# Patient Record
Sex: Female | Born: 1985 | Race: White | Hispanic: No | Marital: Married | Smoking: Never smoker
Health system: Southern US, Community
[De-identification: ages and names within clinical notes are randomized; demographics above are authoritative.]

## PROBLEM LIST (undated history)

## (undated) DIAGNOSIS — E041 Nontoxic single thyroid nodule: Secondary | ICD-10-CM

## (undated) DIAGNOSIS — F909 Attention-deficit hyperactivity disorder, unspecified type: Secondary | ICD-10-CM

## (undated) DIAGNOSIS — R87612 Low grade squamous intraepithelial lesion on cytologic smear of cervix (LGSIL): Secondary | ICD-10-CM

## (undated) DIAGNOSIS — A630 Anogenital (venereal) warts: Secondary | ICD-10-CM

## (undated) DIAGNOSIS — E8881 Metabolic syndrome: Secondary | ICD-10-CM

## (undated) DIAGNOSIS — N83209 Unspecified ovarian cyst, unspecified side: Secondary | ICD-10-CM

## (undated) DIAGNOSIS — G43909 Migraine, unspecified, not intractable, without status migrainosus: Secondary | ICD-10-CM

## (undated) HISTORY — DX: Nontoxic single thyroid nodule: E04.1

## (undated) HISTORY — DX: Unspecified ovarian cyst, unspecified side: N83.209

## (undated) HISTORY — DX: Anogenital (venereal) warts: A63.0

## (undated) HISTORY — DX: Metabolic syndrome: E88.81

## (undated) HISTORY — PX: BREAST ENHANCEMENT SURGERY: SHX7

## (undated) HISTORY — DX: Attention-deficit hyperactivity disorder, unspecified type: F90.9

## (undated) HISTORY — DX: Low grade squamous intraepithelial lesion on cytologic smear of cervix (LGSIL): R87.612

---
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
## 2009-07-01 ENCOUNTER — Emergency Department: Payer: Self-pay | Admitting: Unknown Physician Specialty

## 2010-07-29 ENCOUNTER — Emergency Department: Payer: Self-pay | Admitting: Emergency Medicine

## 2010-11-24 ENCOUNTER — Ambulatory Visit: Payer: Self-pay | Admitting: Family Medicine

## 2011-03-23 DIAGNOSIS — E8881 Metabolic syndrome: Secondary | ICD-10-CM

## 2011-03-23 HISTORY — DX: Metabolic syndrome: E88.81

## 2011-03-23 HISTORY — DX: Metabolic syndrome: E88.810

## 2011-11-18 ENCOUNTER — Emergency Department: Payer: Self-pay | Admitting: Emergency Medicine

## 2011-11-18 LAB — URINALYSIS, COMPLETE
Bilirubin,UR: NEGATIVE
Glucose,UR: NEGATIVE mg/dL (ref 0–75)
Ketone: NEGATIVE
Ph: 5 (ref 4.5–8.0)
RBC,UR: 26 /HPF (ref 0–5)
Squamous Epithelial: 7
WBC UR: 8 /HPF (ref 0–5)

## 2011-11-19 ENCOUNTER — Encounter (HOSPITAL_COMMUNITY): Payer: Self-pay | Admitting: Nurse Practitioner

## 2011-11-19 ENCOUNTER — Emergency Department (HOSPITAL_COMMUNITY)
Admission: EM | Admit: 2011-11-19 | Discharge: 2011-11-19 | Disposition: A | Discharge status: Home / Self Care | Payer: 59 | Attending: Emergency Medicine | Admitting: Emergency Medicine

## 2011-11-19 DIAGNOSIS — R51 Headache: Secondary | ICD-10-CM

## 2011-11-19 DIAGNOSIS — H53149 Visual discomfort, unspecified: Secondary | ICD-10-CM | POA: Insufficient documentation

## 2011-11-19 HISTORY — DX: Migraine, unspecified, not intractable, without status migrainosus: G43.909

## 2011-11-19 MED ORDER — METOCLOPRAMIDE HCL 5 MG/ML IJ SOLN
10.0000 mg | Freq: Once | INTRAMUSCULAR | Status: AC
  Administered 2011-11-19: 10 mg via INTRAVENOUS
  Filled 2011-11-19: qty 2

## 2011-11-19 MED ORDER — BUTALBITAL-APAP-CAFFEINE 50-325-40 MG PO TABS
1.0000 | ORAL_TABLET | Freq: Three times a day (TID) | ORAL | Status: AC | PRN
Start: 2011-11-19 — End: 2012-11-18

## 2011-11-19 MED ORDER — KETOROLAC TROMETHAMINE 30 MG/ML IJ SOLN
30.0000 mg | Freq: Once | INTRAMUSCULAR | Status: AC
  Administered 2011-11-19: 30 mg via INTRAVENOUS
  Filled 2011-11-19: qty 1

## 2011-11-19 MED ORDER — DIPHENHYDRAMINE HCL 50 MG/ML IJ SOLN
25.0000 mg | Freq: Once | INTRAMUSCULAR | Status: AC
  Administered 2011-11-19: 25 mg via INTRAVENOUS
  Filled 2011-11-19: qty 1

## 2011-11-19 NOTE — ED Notes (Signed)
Pt reports she has chronic migraines, pt reports since Tuesday she has been having pressure behind her left eye, her doctor told her if the pressure moved to her right eye then it would be concerning. Pt is now having bilateral pressure behind both eyes. Pt reports she has been taking meds but nothing has given her relief.

## 2011-11-19 NOTE — ED Notes (Addendum)
Pt c/o severe migraine since Tuesday. Hx migraines. Took topamax in the past with no relief. For this migraine she has been taking 800mg  Ibuprofen around the clock with no relief. Pt states migraine pain is usually only on R side but this time is on both sides and has been vomiting all morning. Pt doctor has been treating her with abx for UTI and rule out mono but her mono test came back negative this week. A&Ox4.

## 2011-11-19 NOTE — ED Provider Notes (Signed)
History     CSN: [id number]  Arrival date & time 11/19/11  1013   First MD Initiated Contact with Patient 11/19/11 1024      Chief Complaint  Patient presents with  . Migraine    (Consider location/radiation/quality/duration/timing/severity/associated sxs/prior treatment) HPI Comments: Patient presents with a complaint of headache behind both eyes for the past 5 days. This has been associated with nausea, vomiting, photophobia, phonophobia. Patient states she has a history of migraines. Typically her migraines a right-sided and are improved by ibuprofen or Fioricet. Patient states that ibuprofen has not been helping. She denies recent head injury. Patient denies weakness, numbness, tingling in her arms or legs. No vision changes. Patient has been on Topamax in the past for headaches however she has discontinued this. Patient denies fever. Onset gradual. Course is constant. Nothing makes symptoms better.  The history is provided by the patient.    Past Medical History  Diagnosis Date  . Migraine     History reviewed. No pertinent past surgical history.  History reviewed. No pertinent family history.  History  Substance Use Topics  . Smoking status: Never Smoker   . Smokeless tobacco: Not on file  . Alcohol Use: No    OB History    Grav Para Term Preterm Abortions TAB SAB Ect Mult Living                  Review of Systems  Constitutional: Negative for fever.  HENT: Negative for congestion, rhinorrhea, neck pain, neck stiffness, dental problem and sinus pressure.   Eyes: Positive for photophobia. Negative for discharge, redness and visual disturbance.  Respiratory: Negative for shortness of breath.   Cardiovascular: Negative for chest pain.  Gastrointestinal: Negative for nausea and vomiting.  Musculoskeletal: Negative for gait problem.  Skin: Negative for rash.  Neurological: Positive for headaches. Negative for syncope, speech difficulty, weakness, light-headedness  and numbness.  Psychiatric/Behavioral: Negative for confusion.    Allergies  Review of patient's allergies indicates no known allergies.  Home Medications   Current Outpatient Rx  Name Route Sig Dispense Refill  . AMOXICILLIN 875 MG PO TABS Oral Take 875 mg by mouth 2 (two) times daily.    Marland Kitchen CIPROFLOXACIN HCL 500 MG PO TABS Oral Take 500 mg by mouth 2 (two) times daily.    . IBUPROFEN 200 MG PO TABS Oral Take 800 mg by mouth every 6 (six) hours as needed. For pain      BP 119/90  Pulse 90  Temp 98.1 F (36.7 C) (Oral)  Resp 20  SpO2 100%  Physical Exam  Nursing note and vitals reviewed. Constitutional: She is oriented to person, place, and time. She appears well-developed and well-nourished.  HENT:  Head: Normocephalic and atraumatic.  Right Ear: Tympanic membrane, external ear and ear canal normal.  Left Ear: Tympanic membrane, external ear and ear canal normal.  Nose: Nose normal.  Mouth/Throat: Uvula is midline, oropharynx is clear and moist and mucous membranes are normal.  Eyes: Conjunctivae normal, EOM and lids are normal. Pupils are equal, round, and reactive to light. Right eye exhibits no nystagmus. Left eye exhibits no nystagmus.  Neck: Normal range of motion. Neck supple.  Cardiovascular: Normal rate and regular rhythm.   Pulmonary/Chest: Effort normal and breath sounds normal.  Abdominal: Soft. There is no tenderness.  Musculoskeletal:       Cervical back: She exhibits normal range of motion, no tenderness and no bony tenderness.  Neurological: She is alert and oriented to  person, place, and time. She has normal strength and normal reflexes. No cranial nerve deficit or sensory deficit. She displays a negative Romberg sign. Coordination and gait normal. GCS eye subscore is 4. GCS verbal subscore is 5. GCS motor subscore is 6.  Skin: Skin is warm and dry.  Psychiatric: She has a normal mood and affect.    ED Course  Procedures (including critical care  time)  Labs Reviewed - No data to display No results found.   1. Headache     11:47 AM Patient seen and examined. Medications ordered.   Vital signs reviewed and are as follows: Filed Vitals:   11/19/11 1017  BP: 119/90  Pulse: 90  Temp: 98.1 F (36.7 C)  Resp: 20   Patient states HA not really changed after treatment. Toradol ordered. Patient requests d/c to home. She wants to follow-up with PCP. Urged patient to do so. Will d/c.    MDM  Patient with HA. No thunderclap, no head injury. Normal neurological exam. Patient has previous migraines although HA usually unilateral. Patient appropriate for d/c to home with PCP follow-up. No acute indications for CT scan. Patient appears well.         Renne Crigler, [place] 11/19/11 [phone]

## 2011-11-19 NOTE — ED Notes (Signed)
Pt reports she has gotten some relief with the medication but still can't get comfortable.

## 2011-11-20 NOTE — ED Provider Notes (Signed)
Medical screening examination/treatment/procedure(s) were performed by non-physician practitioner and as supervising physician I was immediately available for consultation/collaboration.  Tobin Chad, MD 11/20/11 [phone]

## 2015-04-26 ENCOUNTER — Other Ambulatory Visit: Payer: Self-pay | Admitting: Nurse Practitioner

## 2015-04-26 DIAGNOSIS — G43809 Other migraine, not intractable, without status migrainosus: Secondary | ICD-10-CM

## 2015-05-13 ENCOUNTER — Ambulatory Visit
Admission: RE | Admit: 2015-05-13 | Discharge: 2015-05-13 | Disposition: A | Discharge status: Home / Self Care | Payer: Medicaid Other | Source: Ambulatory Visit | Attending: Nurse Practitioner | Admitting: Nurse Practitioner

## 2015-05-13 DIAGNOSIS — R590 Localized enlarged lymph nodes: Secondary | ICD-10-CM | POA: Insufficient documentation

## 2015-05-13 DIAGNOSIS — R51 Headache: Secondary | ICD-10-CM | POA: Diagnosis present

## 2015-05-13 DIAGNOSIS — G43809 Other migraine, not intractable, without status migrainosus: Secondary | ICD-10-CM

## 2015-07-27 ENCOUNTER — Emergency Department
Admission: EM | Admit: 2015-07-27 | Discharge: 2015-07-27 | Disposition: A | Discharge status: Home / Self Care | Payer: Medicaid Other | Attending: Emergency Medicine | Admitting: Emergency Medicine

## 2015-07-27 ENCOUNTER — Emergency Department: Payer: Medicaid Other

## 2015-07-27 DIAGNOSIS — G43809 Other migraine, not intractable, without status migrainosus: Secondary | ICD-10-CM | POA: Insufficient documentation

## 2015-07-27 DIAGNOSIS — R51 Headache: Secondary | ICD-10-CM | POA: Diagnosis present

## 2015-07-27 MED ORDER — KETOROLAC TROMETHAMINE 30 MG/ML IJ SOLN
30.0000 mg | Freq: Once | INTRAMUSCULAR | Status: AC
  Administered 2015-07-27: 30 mg via INTRAVENOUS
  Filled 2015-07-27: qty 1

## 2015-07-27 MED ORDER — DIPHENHYDRAMINE HCL 50 MG/ML IJ SOLN
12.5000 mg | Freq: Once | INTRAMUSCULAR | Status: AC
  Administered 2015-07-27: 12.5 mg via INTRAVENOUS
  Filled 2015-07-27: qty 1

## 2015-07-27 MED ORDER — PROCHLORPERAZINE EDISYLATE 5 MG/ML IJ SOLN
5.0000 mg | Freq: Once | INTRAMUSCULAR | Status: AC
  Administered 2015-07-27: 5 mg via INTRAVENOUS
  Filled 2015-07-27: qty 2

## 2015-07-27 MED ORDER — PROCHLORPERAZINE MALEATE 10 MG PO TABS: 10.0000 mg | ORAL_TABLET | Freq: Four times a day (QID) | ORAL | Status: DC | PRN

## 2015-07-27 MED ORDER — DEXTROSE-NACL 5-0.45 % IV SOLN
INTRAVENOUS | Status: DC
  Administered 2015-07-27: 06:00:00 via INTRAVENOUS

## 2015-07-27 MED ORDER — DEXAMETHASONE SODIUM PHOSPHATE 10 MG/ML IJ SOLN
10.0000 mg | Freq: Once | INTRAMUSCULAR | Status: AC
  Administered 2015-07-27: 10 mg via INTRAVENOUS
  Filled 2015-07-27: qty 1

## 2015-07-27 NOTE — ED Provider Notes (Signed)
[facility] Emergency Department Provider Note   ____________________________________________  Time seen: Approximately 5:42 AM  I have reviewed the triage vital signs and the nursing notes.   HISTORY  Chief Complaint Headache    HPI Carolyn Edwards is a 30 y.o. female who presents to the ED from home with a chief complaint of migraine headache. Patient reports history of migraines; treated by her PCP. Had a recent negative MRI of her brain in April. Reports gradual onset global headache 5-6 days. Has been seen by urgent care twice; on the first visit she was diagnosed with sinusitis and started on amoxicillin. On the second visit she was given "a shot in my butt" which relieved her headache for several hours. Symptoms associated with photophobia, dizziness and nausea. Denies associated fever, chills, ear pain, sore throat, chest pain, shortness of breath, abdominal pain, vomiting, diarrhea. Denies recent travel or trauma. The shot made her pain better temporarily; nothing makes her pain worse. States she has brushed ticks off of her daughter but has not removed any from herself.   Past Medical History  Diagnosis Date  . Migraine     There are no active problems to display for this patient.   No past surgical history on file.  No current outpatient prescriptions on file.  Allergies Review of patient's allergies indicates no known allergies.  Family history None for migraines or cerebral aneurysms  Social History Social History  Substance Use Topics  . Smoking status: Never Smoker   . Smokeless tobacco: Not on file  . Alcohol Use: No    Review of Systems  Constitutional: No fever/chills. Eyes: Positive for photophobia. No visual changes. ENT: No sore throat. Cardiovascular: Denies chest pain. Respiratory: Denies shortness of breath. Gastrointestinal: No abdominal pain.  No nausea, no vomiting.  No diarrhea.  No  constipation. Genitourinary: Negative for dysuria. Musculoskeletal: Negative for back pain. Skin: Negative for rash. Neurological: Positive for headache. Negative for focal weakness or numbness.  10-point ROS otherwise negative.  ____________________________________________   PHYSICAL EXAM:  VITAL SIGNS: ED Triage Vitals  Enc Vitals Group     BP 07/27/15 0425 129/82 mmHg     Pulse Rate 07/27/15 0423 70     Resp 07/27/15 0423 18     Temp 07/27/15 0423 98.7 F (37.1 C)     Temp Source 07/27/15 0423 Oral     SpO2 07/27/15 0423 100 %     Weight 07/27/15 0423 130 lb (58.968 kg)     Height 07/27/15 0423 5\' 4"  (1.626 m)     Head Cir --      Peak Flow --      Pain Score 07/27/15 0424 9     Pain Loc --      Pain Edu? --      Excl. in GC? --     Constitutional: Alert and oriented. Well appearing and in mild acute distress. Eyes: Conjunctivae are normal. PERRL. EOMI. Head: Atraumatic. Sinuses nontender to palpation. Nose: No congestion/rhinnorhea. Mouth/Throat: Mucous membranes are moist.  Oropharynx non-erythematous. Neck: No stridor.  Carotid bruits.  Supple neck without meningismus. Cardiovascular: Normal rate, regular rhythm. Grossly normal heart sounds.  Good peripheral circulation. Respiratory: Normal respiratory effort.  No retractions. Lungs CTAB. Gastrointestinal: Soft and nontender. No distention. No abdominal bruits. No CVA tenderness. Musculoskeletal: No lower extremity tenderness nor edema.  No joint effusions. Neurologic:  Normal speech and language. No gross focal neurologic deficits are appreciated. No gait instability. Skin:  Skin is warm, dry and intact. No rash noted. No petechiae. Psychiatric: Mood and affect are normal. Speech and behavior are normal.  ____________________________________________   LABS (all labs ordered are listed, but only abnormal results are displayed)  Labs Reviewed - No data to  display ____________________________________________  EKG  None ____________________________________________  RADIOLOGY  CT head without contrast interpreted per Dr. Grace IsaacWatts: Negative head CT. ____________________________________________   PROCEDURES  Procedure(s) performed: None  Critical Care performed: No  ____________________________________________   INITIAL IMPRESSION / ASSESSMENT AND PLAN / ED COURSE  Pertinent labs & imaging results that were available during my care of the patient were reviewed by me and considered in my medical decision making (see chart for details).  30 year old female who presents with an almost one-week history of global headache. Supple neck without focal neurological deficits. Will obtain CT scan and try headache cocktail.  ----------------------------------------- 7:24 AM on 07/27/2015 -----------------------------------------  Headache improving; states top of head still hurts. Updated patient and mother of negative head CT. Will add Toradol and Decadron. We discussed empirically treating her with doxycycline given her exposure to ticks but no definitive tick bites. Patient is going to the beach next week and is reluctant to take doxycycline given its interaction with the sun. We will draw titers to check RMSF as well as Lyme disease. Strict return precautions given. Both verbalize understanding and agree with plan of care. ____________________________________________   FINAL CLINICAL IMPRESSION(S) / ED DIAGNOSES  Final diagnoses:  Other migraine without status migrainosus, not intractable      NEW MEDICATIONS STARTED DURING THIS VISIT:  New Prescriptions   No medications on file     Note:  This document was prepared using Dragon voice recognition software and may include unintentional dictation errors.    Irean HongJade J Charice Zuno, MD 07/27/15 [id number]

## 2015-07-27 NOTE — Discharge Instructions (Signed)
1. You may take Compazine (#20) as needed for headache. 2. You will be notified of any positive test results for Lyme or RMSF. 3. Return to the ER for worsening symptoms, persistent vomiting, lethargy or other concerns.  Recurrent Migraine Headache A migraine headache is an intense, throbbing pain on one or both sides of your head. Recurrent migraines keep coming back. A migraine can last for 30 minutes to several hours. CAUSES  The exact cause of a migraine headache is not always known. However, a migraine may be caused when nerves in the brain become irritated and release chemicals that cause inflammation. This causes pain. Certain things may also trigger migraines, such as:   Alcohol.  Smoking.  Stress.  Menstruation.  Aged cheeses.  Foods or drinks that contain nitrates, glutamate, aspartame, or tyramine.  Lack of sleep.  Chocolate.  Caffeine.  Hunger.  Physical exertion.  Fatigue.  Medicines used to treat chest pain (nitroglycerine), birth control pills, estrogen, and some blood pressure medicines. SYMPTOMS   Pain on one or both sides of your head.  Pulsating or throbbing pain.  Severe pain that prevents daily activities.  Pain that is aggravated by any physical activity.  Nausea, vomiting, or both.  Dizziness.  Pain with exposure to bright lights, loud noises, or activity.  General sensitivity to bright lights, loud noises, or smells. Before you get a migraine, you may get warning signs that a migraine is coming (aura). An aura may include:  Seeing flashing lights.  Seeing bright spots, halos, or zigzag lines.  Having tunnel vision or blurred vision.  Having feelings of numbness or tingling.  Having trouble talking.  Having muscle weakness. DIAGNOSIS  A recurrent migraine headache is often diagnosed based on:  Symptoms.  Physical examination.  A CT scan or MRI of your head. These imaging tests cannot diagnose migraines but can help rule  out other causes of headaches.  TREATMENT  Medicines may be given for pain and nausea. Medicines can also be given to help prevent recurrent migraines. HOME CARE INSTRUCTIONS  Only take over-the-counter or prescription medicines for pain or discomfort as directed by your health care provider. The use of long-term narcotics is not recommended.  Lie down in a dark, quiet room when you have a migraine.  Keep a journal to find out what may trigger your migraine headaches. For example, write down:  What you eat and drink.  How much sleep you get.  Any change to your diet or medicines.  Limit alcohol consumption.  Quit smoking if you smoke.  Get 7-9 hours of sleep, or as recommended by your health care provider.  Limit stress.  Keep lights dim if bright lights bother you and make your migraines worse. SEEK MEDICAL CARE IF:   You do not get relief from the medicines given to you.  You have a recurrence of pain.  You have a fever. SEEK IMMEDIATE MEDICAL CARE IF:  Your migraine becomes severe.  You have a stiff neck.  You have loss of vision.  You have muscular weakness or loss of muscle control.  You start losing your balance or have trouble walking.  You feel faint or pass out.  You have severe symptoms that are different from your first symptoms. MAKE SURE YOU:   Understand these instructions.  Will watch your condition.  Will get help right away if you are not doing well or get worse.   This information is not intended to replace advice given to you by  your health care provider. Make sure you discuss any questions you have with your health care provider.   Document Released: 10/11/2000 Document Revised: 02/06/2014 Document Reviewed: 09/23/2012 Elsevier Interactive Patient Education [employer]2016 Else[employer] Inc.

## 2015-07-27 NOTE — ED Notes (Signed)
Pt reports since last Wednesday has had HA to temples; st has been seen at urgent care x 2. Pt has hx of headaches states feels different.

## 2015-07-27 NOTE — ED Notes (Signed)
Pt presents to ED with c/o generalized HA/migraine since last Wednesday. Pt reports h/x of migraines but states this is different from her usual migraine pain. Pt states "usual migraines are on the right side behind the right eye", but reports this HA is "all over" and generalized. Pt reports (+) nausea, but denies vomiting. Pt reports dizziness and lightheadedness, but denies unilateral weakness in her upper or lower extremities. Pt reports photophobia with her usual migraines, but "with this HA it isn't so bad".

## 2015-07-29 LAB — B. BURGDORFI ANTIBODIES

## 2015-07-29 LAB — ROCKY MTN SPOTTED FVR ABS PNL(IGG+IGM)
RMSF IGG: NEGATIVE
RMSF IGM: 0.6 {index} (ref 0.00–0.89)

## 2015-07-30 ENCOUNTER — Telehealth: Payer: Self-pay | Admitting: Emergency Medicine

## 2015-07-30 NOTE — ED Notes (Signed)
Called patient to inform of rmsf and lyme test results.  Left message asking her to call me or call pcp for follow up.

## 2015-11-23 DIAGNOSIS — N83209 Unspecified ovarian cyst, unspecified side: Secondary | ICD-10-CM

## 2015-11-23 HISTORY — DX: Unspecified ovarian cyst, unspecified side: N83.209

## 2017-01-02 ENCOUNTER — Other Ambulatory Visit: Payer: Self-pay

## 2017-01-02 ENCOUNTER — Encounter: Payer: Self-pay | Admitting: Obstetrics and Gynecology

## 2017-01-02 ENCOUNTER — Ambulatory Visit (INDEPENDENT_AMBULATORY_CARE_PROVIDER_SITE_OTHER): Payer: Medicaid Other | Admitting: Obstetrics and Gynecology

## 2017-01-02 VITALS — BP 94/58 | HR 69 | Ht 65.0 in | Wt 138.0 lb

## 2017-01-02 DIAGNOSIS — Z01419 Encounter for gynecological examination (general) (routine) without abnormal findings: Secondary | ICD-10-CM | POA: Diagnosis not present

## 2017-01-02 DIAGNOSIS — Z0000 Encounter for general adult medical examination without abnormal findings: Secondary | ICD-10-CM | POA: Diagnosis not present

## 2017-01-02 DIAGNOSIS — T8339XA Other mechanical complication of intrauterine contraceptive device, initial encounter: Secondary | ICD-10-CM

## 2017-01-02 NOTE — Patient Instructions (Signed)
Preventive Care 18-39 Years, Female Preventive care refers to lifestyle choices and visits with your health care provider that can promote health and wellness. What does preventive care include?  A yearly physical exam. This is also called an annual well check.  Dental exams once or twice a year.  Routine eye exams. Ask your health care provider how often you should have your eyes checked.  Personal lifestyle choices, including: ? Daily care of your teeth and gums. ? Regular physical activity. ? Eating a healthy diet. ? Avoiding tobacco and drug use. ? Limiting alcohol use. ? Practicing safe sex. ? Taking vitamin and mineral supplements as recommended by your health care provider. What happens during an annual well check? The services and screenings done by your health care provider during your annual well check will depend on your age, overall health, lifestyle risk factors, and family history of disease. Counseling Your health care provider may ask you questions about your:  Alcohol use.  Tobacco use.  Drug use.  Emotional well-being.  Home and relationship well-being.  Sexual activity.  Eating habits.  Work and work Statistician.  Method of birth control.  Menstrual cycle.  Pregnancy history.  Screening You may have the following tests or measurements:  Height, weight, and BMI.  Diabetes screening. This is done by checking your blood sugar (glucose) after you have not eaten for a while (fasting).  Blood pressure.  Lipid and cholesterol levels. These may be checked every 5 years starting at age 66.  Skin check.  Hepatitis C blood test.  Hepatitis B blood test.  Sexually transmitted disease (STD) testing.  BRCA-related cancer screening. This may be done if you have a family history of breast, ovarian, tubal, or peritoneal cancers.  Pelvic exam and Pap test. This may be done every 3 years starting at age 40. Starting at age 59, this may be done every 5  years if you have a Pap test in combination with an HPV test.  Discuss your test results, treatment options, and if necessary, the need for more tests with your health care provider. Vaccines Your health care provider may recommend certain vaccines, such as:  Influenza vaccine. This is recommended every year.  Tetanus, diphtheria, and acellular pertussis (Tdap, Td) vaccine. You may need a Td booster every 10 years.  Varicella vaccine. You may need this if you have not been vaccinated.  HPV vaccine. If you are 69 or younger, you may need three doses over 6 months.  Measles, mumps, and rubella (MMR) vaccine. You may need at least one dose of MMR. You may also need a second dose.  Pneumococcal 13-valent conjugate (PCV13) vaccine. You may need this if you have certain conditions and were not previously vaccinated.  Pneumococcal polysaccharide (PPSV23) vaccine. You may need one or two doses if you smoke cigarettes or if you have certain conditions.  Meningococcal vaccine. One dose is recommended if you are age 27-21 years and a first-year college student living in a residence hall, or if you have one of several medical conditions. You may also need additional booster doses.  Hepatitis A vaccine. You may need this if you have certain conditions or if you travel or work in places where you may be exposed to hepatitis A.  Hepatitis B vaccine. You may need this if you have certain conditions or if you travel or work in places where you may be exposed to hepatitis B.  Haemophilus influenzae type b (Hib) vaccine. You may need this if  you have certain risk factors.  Talk to your health care provider about which screenings and vaccines you need and how often you need them. This information is not intended to replace advice given to you by your health care provider. Make sure you discuss any questions you have with your health care provider. Document Released: 03/14/2001 Document Revised: 10/06/2015  Document Reviewed: 11/17/2014 Elsevier Interactive Patient Education  2017 [employer].

## 2017-01-02 NOTE — Progress Notes (Signed)
Gynecology Annual Exam  PCP: Lyndon CodeKhan, Fozia M, MD  Chief Complaint:  Chief Complaint  Patient presents with  . Gynecologic Exam    Mirena expired July, constant pink d/c & nausea, - icon w/pcp Friday    History of Present Illness: Patient is a 31 y.o. G1P1001 presents for annual exam. The patient has no complaints today.   LMP: Patient's last menstrual period was 12/29/2016. Average Interval: regular, 28 days Duration of flow: 3 days Heavy Menses: no Clots: no Intermenstrual Bleeding: no Postcoital Bleeding: no Dysmenorrhea: no  The patient is sexually active. She currently uses IUD for contraception. She denies dyspareunia.  The patient does perform self breast exams.  There is no notable family history of breast or ovarian cancer in her family.  The patient wears seatbelts: yes.   The patient has regular exercise: not asked.    The patient denies current symptoms of depression.    Review of Systems: Review of Systems  Constitutional: Negative for chills and fever.  HENT: Negative for congestion.   Respiratory: Negative for cough and shortness of breath.   Cardiovascular: Negative for chest pain and palpitations.  Gastrointestinal: Negative for abdominal pain, constipation, diarrhea, heartburn, nausea and vomiting.  Genitourinary: Negative for dysuria, frequency and urgency.  Skin: Negative for itching and rash.  Neurological: Negative for dizziness and headaches.  Endo/Heme/Allergies: Negative for polydipsia.  Psychiatric/Behavioral: Negative for depression.    Past Medical History:  Past Medical History:  Diagnosis Date  . Dysmetabolic syndrome X 03/23/2011  . Genital warts   . LGSIL on Pap smear of cervix 2006  . Migraine   . Ovarian cyst 11/23/2015  . Thyroid nodule     Past Surgical History:  Past Surgical History:  Procedure Laterality Date  . BREAST ENHANCEMENT SURGERY    . COLPOSCOPY  01/2005   bxs neg  . INTRAUTERINE DEVICE (IUD) INSERTION   08/2006   Mirena    Gynecologic History:  Patient's last menstrual period was 12/29/2016. Contraception: IUD 06/20/2011 Last Pap: Results were:03/24/15 NIL and HR HPV negative   Obstetric History: G1P1001  Family History:  Family History  Problem Relation Age of Onset  . Diabetes Mother   . Colon cancer Maternal Grandfather [id number]    Social History:  Social History   Socioeconomic History  . Marital status: Married    Spouse name: Not on file  . Number of children: Not on file  . Years of education: Not on file  . Highest education level: Not on file  Social Needs  . Financial resource strain: Not on file  . Food insecurity - worry: Not on file  . Food insecurity - inability: Not on file  . Transportation needs - medical: Not on file  . Transportation needs - non-medical: Not on file  Occupational History  . Not on file  Tobacco Use  . Smoking status: Never Smoker  . Smokeless tobacco: Never Used  Substance and Sexual Activity  . Alcohol use: No  . Drug use: No  . Sexual activity: Yes    Birth control/protection: IUD  Other Topics Concern  . Not on file  Social History Narrative  . Not on file    Allergies:  No Known Allergies  Medications: Prior to Admission medications   Medication Sig Start Date End Date Taking? Authorizing Provider  levonorgestrel (MIRENA) 20 MCG/24HR IUD 1 each by Intrauterine route once. 08/14/11  Yes [provider]  prochlorperazine (COMPAZINE) 10 MG tablet Take 1 tablet (  10 mg total) by mouth every 6 (six) hours as needed for nausea. Patient not taking: Reported on 01/02/2017 07/27/15   Irean HongSung, Jade J, MD    Physical Exam Vitals: Blood pressure (!) 94/58, pulse 69, height 5\' 5"  (1.651 m), weight 138 lb (62.6 kg), last menstrual period 12/29/2016.  General: NAD HEENT: normocephalic, anicteric Thyroid: no enlargement, no palpable nodules Pulmonary: No increased work of breathing, CTAB Cardiovascular: RRR, distal pulses  2+ Breast: Breast symmetrical, no tenderness, no palpable nodules or masses, no skin or nipple retraction present, no nipple discharge.  No axillary or supraclavicular lymphadenopathy. Abdomen: NABS, soft, non-tender, non-distended.  Umbilicus without lesions.  No hepatomegaly, splenomegaly or masses palpable. No evidence of hernia  Genitourinary:  External: Normal external female genitalia.  Normal urethral meatus, normal  Bartholin's and Skene's glands.    Vagina: Normal vaginal mucosa, no evidence of prolapse.    Cervix: Grossly normal in appearance, no bleeding.  IUD STRINGS NOT VISUALIZED  Uterus: Non-enlarged, mobile, normal contour.  No CMT  Adnexa: ovaries non-enlarged, no adnexal masses  Rectal: deferred  Lymphatic: no evidence of inguinal lymphadenopathy Extremities: no edema, erythema, or tenderness Neurologic: Grossly intact Psychiatric: mood appropriate, affect full  Female chaperone present for pelvic and breast  portions of the physical exam    Assessment: 31 y.o. G1P1001 routine annual exam  Plan: Problem List Items Addressed This Visit    None    Visit Diagnoses    Encounter for annual routine gynecological examination    -  Primary   Other mechanical complication of intrauterine contraceptive device, initial encounter          1) STI screening was offered and declined  2) ASCCP guidelines and rational discussed.  Patient opts for every 3 years screening interval  3) Contraception - interested in discontinuing IUD but strings not visualized.  IUD present in 2017 ultrasound and properly deployed.  Attempts at sweeping down string unsuccesful.  I discussed trial of IUD hook with possible ultrasound guidance vs removal in OR.  Patient to contact me regarding her decision.   We discussed that despite FDA labeling good data that effectiveness of her IUD is good for another 2 years.    4) Routine healthcare maintenance including cholesterol, diabetes screening  discussed managed by PCP  5) Follow up 1 year for routine annual exam

## 2017-02-05 ENCOUNTER — Telehealth: Payer: Self-pay | Admitting: Obstetrics and Gynecology

## 2017-02-05 NOTE — Telephone Encounter (Signed)
-----   Message from Vena AustriaAndreas Staebler, MD sent at 02/05/2017  8:20 AM EST ----- Regarding: IUD removal OR Surgery Date: Next available  LOS: outpatient  Surgery Booking Request Patient Full Name: Carolyn Edwards MRN: [id number]  DOB: 11/08/1985  Surgeon: Vena AustriaAndreas Staebler, MD  Requested Surgery Date and Time: next available Primary Diagnosis and Code: IUD mechanical complication Secondary Diagnosis and Code:  Surgical Procedure: Hysteroscopy, removal of uterine foreign body L&D Notification:N/A Admission Status: same day surgery Length of Surgery: 1h Special Case Needs: none H&P:  (date) Phone Interview or Office Pre-Admit: phone Interpreter: none Language: English Medical Clearance: none Special Scheduling Instructions: none

## 2017-02-05 NOTE — Telephone Encounter (Signed)
Lmtrc

## 2017-02-20 NOTE — Telephone Encounter (Signed)
Patient is aware of [facility] @ 2:10pm w/ Dr. Bonney AidStaebler, Pre-admit Testing phone interview to be scheduled, and OR on 03/22/17.

## 2017-02-23 ENCOUNTER — Encounter: Payer: Medicaid Other | Admitting: Obstetrics and Gynecology

## 2017-03-13 NOTE — Progress Notes (Signed)
Obstetrics & Gynecology Surgery H&P    Chief Complaint: Scheduled Surgery   History of Present Illness: Patient is a 32 y.o. G1P1001 presenting for scheduled hysteroscopic removal of uterine foreign body (Mirena IUD placed 06/20/2011), for the treatment or further evaluation of desire to discontinue IUD for contraception.   Prior Treatments prior to proceeding with surgery include: attempt at removal in office unsuccessful  Preoperative Pap: 03/24/2015 Results: NIL HPV negative  Preoperative Endometrial biopsy: N/A Preoperative Ultrasound: ultrasound 2017 at which time no string visualized on exam showed IUD in proper location within the endometrial cavity   Review of Systems:10 point review of systems  Past Medical History:  Past Medical History:  Diagnosis Date  . Dysmetabolic syndrome X 03/23/2011  . Genital warts   . LGSIL on Pap smear of cervix 2006  . Migraine   . Ovarian cyst 11/23/2015  . Thyroid nodule     Past Surgical History:  Past Surgical History:  Procedure Laterality Date  . BREAST ENHANCEMENT SURGERY    . COLPOSCOPY  01/2005   bxs neg  . INTRAUTERINE DEVICE (IUD) INSERTION  08/2006   Mirena    Family History:  Family History  Problem Relation Age of Onset  . Diabetes Mother   . Colon cancer Maternal Grandfather 71    Social History:  Social History   Socioeconomic History  . Marital status: Married    Spouse name: Not on file  . Number of children: Not on file  . Years of education: Not on file  . Highest education level: Not on file  Social Needs  . Financial resource strain: Not on file  . Food insecurity - worry: Not on file  . Food insecurity - inability: Not on file  . Transportation needs - medical: Not on file  . Transportation needs - non-medical: Not on file  Occupational History  . Not on file  Tobacco Use  . Smoking status: Never Smoker  . Smokeless tobacco: Never Used  Substance and Sexual Activity  . Alcohol use: No  .  Drug use: No  . Sexual activity: Yes    Birth control/protection: IUD  Other Topics Concern  . Not on file  Social History Narrative  . Not on file    Allergies:  Allergies  Allergen Reactions  . Amoxicillin Nausea And Vomiting    dizziness Has patient had a PCN reaction causing immediate rash, facial/tongue/throat swelling, SOB or lightheadedness with hypotension: No Has patient had a PCN reaction causing severe rash involving mucus membranes or skin necrosis: No Has patient had a PCN reaction that required hospitalization: No Has patient had a PCN reaction occurring within the last 10 years: Yes If all of the above answers are "NO", then may proceed with Cephalosporin use.     Medications: Prior to Admission medications   Medication Sig Start Date End Date Taking? Authorizing Provider  amphetamine-dextroamphetamine (ADDERALL) 10 MG tablet Take 10 mg by mouth daily as needed (focus).  02/08/17   [provider]  Aspirin-Acetaminophen-Caffeine (GOODY HEADACHE PO) Take 1 packet by mouth daily as needed (headaches).    [provider]  doxycycline (VIBRA-TABS) 100 MG tablet Take 100 mg by mouth 2 (two) times daily.  03/03/17   [provider]  levonorgestrel (MIRENA) 20 MCG/24HR IUD 1 each by Intrauterine route once. 08/14/11   [provider]  Multiple Vitamins-Minerals (HAIR SKIN AND NAILS FORMULA) TABS Take 3 tablets by mouth daily.    [provider]  Physical Exam Blood pressure 108/60, pulse 81, height 5\' 5"  (1.651 m), weight 140 lb (63.5 kg).  General: NAD HEENT: normocephalic, anicteric Pulmonary: No increased work of breathing, CTAB Cardiovascular: RRR, distal pulses 2+ Abdomen: soft, non-tender, non-distended Extremities: no edema, erythema, or tenderness Neurologic: Grossly intact Psychiatric: mood appropriate, affect full  Imaging No results found.  Assessment: 32 y.o. G1P1001 presenting for scheduled hysteroscopy,  removal of uterine foreign body  Plan: 1) I have discussed with the patient the indications for the procedure. Included in the discussion were the options of therapy, as wall as their individual risks, benefits, and complications. Ample time was given to answer all questions.   After consideration of her history and findings, mutual decision has been made to proceed hysteroscopy removal of IUD. While the incidence is low, the risks from this surgery include, but are not limited to, the risks of anesthesia, hemorrhage, infection, perforation, and injury to adjacent structures including bowel, bladder and blood vessels.  - undecided on on whether she wants to switch to alternate contraception but would need to be non-estrogen containing given history of migraines  2) Routine postoperative instructions were reviewed with the patient and her family in detail today including the expected length of recovery and likely postoperative course.  The patient concurred with the proposed plan, giving informed written consent for the surgery today.  Patient instructed on the importance of being NPO after midnight prior to her procedure.  If warranted preoperative prophylactic antibiotics and SCDs ordered on call to the OR to meet SCIP guidelines and adhere to recommendation laid forth in ACOG Practice Bulletin Number 104 May 2009  "Antibiotic Prophylaxis for Gynecologic Procedures".     Vena AustriaAndreas Kristiann Noyce, MD, Evern CoreFACOG [facility], [facility] 03/13/2017, 3:25 PM

## 2017-03-14 ENCOUNTER — Ambulatory Visit (INDEPENDENT_AMBULATORY_CARE_PROVIDER_SITE_OTHER): Payer: Medicaid Other | Admitting: Obstetrics and Gynecology

## 2017-03-14 ENCOUNTER — Encounter: Payer: Self-pay | Admitting: Obstetrics and Gynecology

## 2017-03-14 VITALS — BP 108/60 | HR 81 | Ht 65.0 in | Wt 140.0 lb

## 2017-03-14 DIAGNOSIS — Z01818 Encounter for other preprocedural examination: Secondary | ICD-10-CM

## 2017-03-14 DIAGNOSIS — T8339XD Other mechanical complication of intrauterine contraceptive device, subsequent encounter: Secondary | ICD-10-CM

## 2017-03-15 ENCOUNTER — Encounter
Admission: RE | Admit: 2017-03-15 | Discharge: 2017-03-15 | Disposition: A | Discharge status: Home / Self Care | Payer: Medicaid Other | Source: Ambulatory Visit | Attending: Obstetrics and Gynecology | Admitting: Obstetrics and Gynecology

## 2017-03-15 ENCOUNTER — Other Ambulatory Visit: Payer: Self-pay

## 2017-03-15 NOTE — Patient Instructions (Signed)
Your procedure is scheduled on: 03/22/17 Thurs Report to Same Day Surgery 2nd floor medical mall [facility] Entrance-take elevator on left to 2nd floor.  Check in with surgery information desk.) To find out your arrival time please call [phone] between 1PM - 3PM on 03/21/17 Wed  Remember: Instructions that are not followed completely may result in serious medical risk, up to and including death, or upon the discretion of your surgeon and anesthesiologist your surgery may need to be rescheduled.    _x___ 1. Do not eat food after midnight the night before your procedure. You may drink clear liquids up to 2 hours before you are scheduled to arrive at the hospital for your procedure.  Do not drink clear liquids within 2 hours of your scheduled arrival to the hospital.  Clear liquids include  --Water or Apple juice without pulp  --Clear carbohydrate beverage such as ClearFast or Gatorade  --Black Coffee or Clear Tea (No milk, no creamers, do not add anything to                  the coffee or Tea Type 1 and type 2 diabetics should only drink water.  No gum chewing or hard candies.     __x__ 2. No Alcohol for 24 hours before or after surgery.   __x__3. No Smoking or e-cigarettes for 24 prior to surgery.  Do not use any chewable tobacco products for at least 6 hour prior to surgery   ____  4. Bring all medications with you on the day of surgery if instructed.    __x__ 5. Notify your doctor if there is any change in your medical condition     (cold, fever, infections).    x___6. On the morning of surgery brush your teeth with toothpaste and water.  You may rinse your mouth with mouth wash if you wish.  Do not swallow any toothpaste or mouthwash.   Do not wear jewelry, make-up, hairpins, clips or nail polish.  Do not wear lotions, powders, or perfumes. You may wear deodorant.  Do not shave 48 hours prior to surgery. Men may shave face and neck.  Do not bring valuables to the hospital.     [facility] is not responsible for any belongings or valuables.               Contacts, dentures or bridgework may not be worn into surgery.  Leave your suitcase in the car. After surgery it may be brought to your room.  For patients admitted to the hospital, discharge time is determined by your                       treatment team.  _  Patients discharged the day of surgery will not be allowed to drive home.  You will need someone to drive you home and stay with you the night of your procedure.    Please read over the following fact sheets that you were given:   [facility] Preparing for Surgery and or MRSA Information   _x___ Take anti-hypertensive listed below, cardiac, seizure, asthma,     anti-reflux and psychiatric medicines. These include:  1. None  2.  3.  4.  5.  6.  ____Fleets enema or Magnesium Citrate as directed.   _x___ Use CHG Soap or sage wipes as directed on instruction sheet   ____ Use inhalers on the day of surgery and bring to hospital day of surgery  ____ Stop Metformin and Janumet 2 days prior to surgery.    ____ Take 1/2 of usual insulin dose the night before surgery and none on the morning     surgery.   _x___ Follow recommendations from Cardiologist, Pulmonologist or PCP regarding          stopping Aspirin, Coumadin, Plavix ,Eliquis, Effient, or Pradaxa, and Pletal.  X____Stop Anti-inflammatories such as Advil, Aleve, Ibuprofen, Motrin, Naproxen, Naprosyn, Goodies powders or aspirin products. OK to take Tylenol and or Celebrex. Stop Marlin CanaryGoody powers til after surgery   _x___ Stop supplements until after surgery.  But may continue Vitamin D, Vitamin B,       and multivitamin.   ____ Bring C-Pap to the hospital.

## 2017-03-22 ENCOUNTER — Encounter
Admission: RE | Disposition: A | Discharge status: Home / Self Care | Payer: Self-pay | Source: Ambulatory Visit | Attending: Obstetrics and Gynecology

## 2017-03-22 ENCOUNTER — Ambulatory Visit: Payer: Medicaid Other | Admitting: Registered Nurse

## 2017-03-22 ENCOUNTER — Ambulatory Visit
Admission: RE | Admit: 2017-03-22 | Discharge: 2017-03-22 | Disposition: A | Discharge status: Home / Self Care | Payer: Medicaid Other | Source: Ambulatory Visit | Attending: Obstetrics and Gynecology | Admitting: Obstetrics and Gynecology

## 2017-03-22 ENCOUNTER — Encounter: Payer: Self-pay | Admitting: *Deleted

## 2017-03-22 DIAGNOSIS — Z30432 Encounter for removal of intrauterine contraceptive device: Secondary | ICD-10-CM

## 2017-03-22 HISTORY — PX: FOREIGN BODY REMOVAL: SHX962

## 2017-03-22 LAB — POCT PREGNANCY, URINE: Preg Test, Ur: NEGATIVE

## 2017-03-22 SURGERY — REMOVAL, FOREIGN BODY
Anesthesia: General | Site: Uterus | Wound class: Clean Contaminated

## 2017-03-22 MED ORDER — KETOROLAC TROMETHAMINE 30 MG/ML IJ SOLN
INTRAMUSCULAR | Status: AC
  Filled 2017-03-22: qty 1

## 2017-03-22 MED ORDER — PROPOFOL 10 MG/ML IV BOLUS
INTRAVENOUS | Status: DC | PRN
  Administered 2017-03-22: 200 mg via INTRAVENOUS

## 2017-03-22 MED ORDER — LIDOCAINE HCL (CARDIAC) 20 MG/ML IV SOLN
INTRAVENOUS | Status: DC | PRN
  Administered 2017-03-22: 60 mg via INTRAVENOUS

## 2017-03-22 MED ORDER — HYDROMORPHONE HCL 1 MG/ML IJ SOLN
INTRAMUSCULAR | Status: AC
  Filled 2017-03-22: qty 1

## 2017-03-22 MED ORDER — ACETAMINOPHEN 160 MG/5ML PO SOLN
325.0000 mg | ORAL | Status: DC | PRN
  Filled 2017-03-22: qty 20.3

## 2017-03-22 MED ORDER — NORETHINDRONE ACETATE 5 MG PO TABS: 5.0000 mg | ORAL_TABLET | Freq: Every day | ORAL | 11 refills | Status: DC

## 2017-03-22 MED ORDER — DEXAMETHASONE SODIUM PHOSPHATE 10 MG/ML IJ SOLN
INTRAMUSCULAR | Status: DC | PRN
  Administered 2017-03-22: 10 mg via INTRAVENOUS

## 2017-03-22 MED ORDER — MIDAZOLAM HCL 5 MG/5ML IJ SOLN
INTRAMUSCULAR | Status: AC
  Filled 2017-03-22: qty 5

## 2017-03-22 MED ORDER — KETOROLAC TROMETHAMINE 30 MG/ML IJ SOLN
30.0000 mg | Freq: Once | INTRAMUSCULAR | Status: DC | PRN
  Administered 2017-03-22: 30 mg via INTRAVENOUS

## 2017-03-22 MED ORDER — ACETAMINOPHEN 325 MG PO TABS: 325.0000 mg | ORAL_TABLET | ORAL | Status: DC | PRN

## 2017-03-22 MED ORDER — HYDROCODONE-ACETAMINOPHEN 7.5-325 MG PO TABS
1.0000 | ORAL_TABLET | Freq: Once | ORAL | Status: DC | PRN
Start: 2017-03-22 — End: 2017-03-22
  Filled 2017-03-22: qty 1

## 2017-03-22 MED ORDER — LACTATED RINGERS IV SOLN
INTRAVENOUS | Status: DC | PRN
  Administered 2017-03-22: 15:00:00 via INTRAVENOUS

## 2017-03-22 MED ORDER — MIDAZOLAM HCL 2 MG/2ML IJ SOLN
INTRAMUSCULAR | Status: DC | PRN
  Administered 2017-03-22: 3 mg via INTRAVENOUS
  Administered 2017-03-22: 2 mg via INTRAVENOUS

## 2017-03-22 MED ORDER — GLYCOPYRROLATE 0.2 MG/ML IJ SOLN
INTRAMUSCULAR | Status: DC | PRN
  Administered 2017-03-22: 0.1 mg via INTRAVENOUS

## 2017-03-22 MED ORDER — ONDANSETRON HCL 4 MG/2ML IJ SOLN
INTRAMUSCULAR | Status: DC | PRN
  Administered 2017-03-22: 4 mg via INTRAVENOUS

## 2017-03-22 MED ORDER — FENTANYL CITRATE (PF) 100 MCG/2ML IJ SOLN: 25.0000 ug | INTRAMUSCULAR | Status: DC | PRN

## 2017-03-22 MED ORDER — MEPERIDINE HCL 50 MG/ML IJ SOLN: 6.2500 mg | INTRAMUSCULAR | Status: DC | PRN

## 2017-03-22 MED ORDER — IBUPROFEN 600 MG PO TABS: 600.0000 mg | ORAL_TABLET | Freq: Four times a day (QID) | ORAL | 3 refills | Status: DC | PRN

## 2017-03-22 MED ORDER — HYDROMORPHONE HCL 1 MG/ML IJ SOLN
INTRAMUSCULAR | Status: DC | PRN
  Administered 2017-03-22: 1 mg via INTRAVENOUS

## 2017-03-22 MED ORDER — PROMETHAZINE HCL 25 MG/ML IJ SOLN: 6.2500 mg | INTRAMUSCULAR | Status: DC | PRN

## 2017-03-22 SURGICAL SUPPLY — 14 items
CANISTER SUCT 3000ML PPV (MISCELLANEOUS) ×3 IMPLANT
CATH ROBINSON RED A/P 16FR (CATHETERS) ×3 IMPLANT
GLOVE BIO SURGEON STRL SZ7 (GLOVE) ×3 IMPLANT
GOWN STRL REUS W/ TWL LRG LVL3 (GOWN DISPOSABLE) ×4 IMPLANT
GOWN STRL REUS W/TWL LRG LVL3 (GOWN DISPOSABLE) ×2
IV LACTATED RINGERS 1000ML (IV SOLUTION) ×3 IMPLANT
KIT TURNOVER CYSTO (KITS) ×3 IMPLANT
PACK DNC HYST (MISCELLANEOUS) ×3 IMPLANT
PAD OB MATERNITY 4.3X12.25 (PERSONAL CARE ITEMS) ×3 IMPLANT
PAD PREP 24X41 OB/GYN DISP (PERSONAL CARE ITEMS) ×3 IMPLANT
SEAL ROD LENS SCOPE MYOSURE (ABLATOR) ×3 IMPLANT
SET CYSTO W/LG BORE CLAMP LF (SET/KITS/TRAYS/PACK) ×3 IMPLANT
TUBING CONNECTING 10 (TUBING) ×3 IMPLANT
TUBING HYSTEROSCOPY DOLPHIN (MISCELLANEOUS) IMPLANT

## 2017-03-22 NOTE — Anesthesia Post-op Follow-up Note (Signed)
Anesthesia QCDR form completed.        

## 2017-03-22 NOTE — Anesthesia Preprocedure Evaluation (Signed)
Anesthesia Evaluation  Patient identified by MRN, date of birth, ID band Patient awake    Reviewed: Allergy & Precautions, H&P , NPO status , reviewed documented beta blocker date and time   Airway Mallampati: I  TM Distance: >3 FB     Dental  (+) Caps   Pulmonary neg pulmonary ROS,    Pulmonary exam normal        Cardiovascular negative cardio ROS Normal cardiovascular exam     Neuro/Psych  Headaches, negative psych ROS   GI/Hepatic negative GI ROS, Neg liver ROS,   Endo/Other  negative endocrine ROS  Renal/GU negative Renal ROS  negative genitourinary   Musculoskeletal   Abdominal   Peds  Hematology negative hematology ROS (+)   Anesthesia Other Findings Dental veneers  Reproductive/Obstetrics negative OB ROS                             Anesthesia Physical Anesthesia Plan  ASA: II  Anesthesia Plan: General   Post-op Pain Management:    Induction:   PONV Risk Score and Plan: 3 and Propofol infusion, Ondansetron and Midazolam  Airway Management Planned:   Additional Equipment:   Intra-op Plan:   Post-operative Plan:   Informed Consent: I have reviewed the patients History and Physical, chart, labs and discussed the procedure including the risks, benefits and alternatives for the proposed anesthesia with the patient or authorized representative who has indicated his/her understanding and acceptance.   Dental Advisory Given  Plan Discussed with: CRNA  Anesthesia Plan Comments:         Anesthesia Quick Evaluation

## 2017-03-22 NOTE — Anesthesia Procedure Notes (Signed)
Procedure Name: LMA Insertion Date/Time: 03/22/2017 2:56 PM Performed by: Sherol DadeMacMang, Marjoria Mancillas H, CRNA Pre-anesthesia Checklist: Patient identified, Emergency Drugs available, Suction available, Patient being monitored and Timeout performed Patient Re-evaluated:Patient Re-evaluated prior to induction Oxygen Delivery Method: Circle system utilized Preoxygenation: Pre-oxygenation with 100% oxygen Induction Type: IV induction Ventilation: Mask ventilation without difficulty LMA: LMA inserted LMA Size: 3.0 Tube type: Oral Number of attempts: 1 Tube secured with: Tape Dental Injury: Teeth and Oropharynx as per pre-operative assessment

## 2017-03-22 NOTE — Transfer of Care (Signed)
Immediate Anesthesia Transfer of Care Note  Patient: Carolyn Edwards  Procedure(s) Performed: FOREIGN BODY REMOVAL (N/A Uterus)  Patient Location: PACU  Anesthesia Type:General  Level of Consciousness: drowsy and patient cooperative  Airway & Oxygen Therapy: Patient Spontanous Breathing and Patient connected to face mask oxygen  Post-op Assessment: Report given to RN, Post -op Vital signs reviewed and stable and Patient moving all extremities X 4  Post vital signs: Reviewed and stable  Last Vitals:  Vitals:   03/22/17 1519 03/22/17 1520  BP: 91/61 91/61  Pulse: 61 61  Resp: 14 (!) 0  Temp: (!) 36 C (!) 36.1 C  SpO2: 100% 100%    Last Pain:  Vitals:   03/22/17 1405  PainSc: 0-No pain         Complications: No apparent anesthesia complications

## 2017-03-22 NOTE — Discharge Instructions (Signed)

## 2017-03-22 NOTE — Op Note (Signed)
Preoperative Diagnosis: 1) 32 y.o. with retained Mirena IUD  Postoperative Diagnosis: 1) 32 y.o. with retained Mirena IUD  Operation Performed: Exam under anesthesia, removal of uterine foreign body  Indication: 32 year old with retained IUD, presence confirmed on prior ultrasound, string not visualized.  Failed attempts at in office removal  Anesthesia: General  Primary Surgeon: Vena AustriaAndreas Donis Kotowski, MD  Assistant: none  Preoperative Antibiotics: none  Estimated Blood Loss: 30 mL  IV Fluids: 750mL  Urine Output:: ~1520mL straight cath  Drains or Tubes: none  Implants: none  Specimens Removed: Mirena IUD  Complications: none  Intraoperative Findings:  String were visualized during exam under anesthesia, hysteroscopy was therefore not needed to facilitate IUD removal  Patient Condition: stable  Procedure in Detail:  Patient was taken to the operating room were she was administered general endotracheal anesthesia.  She was positioned in the dorsal lithotomy position utilizing Allen stirups, prepped and draped in the usual sterile fashion.  Uterus was noted to be non-enlarged in size, anteverted.   Prior to proceeding with the case a time out was performed.  Attention was turned to the patient's pelvis.  A red rubber catheter was used to empty the patient's bladder.  An operative speculum was placed to allow visualization of the cervix.  The anterior lip of the cervix was grasped with a single tooth tenaculum.  On inspection of the cervix the IUD string were visualized 1 cm pas the external cervical os.  The strings were grabbed with a ring forceps and the IUD was removed intact.  The single tooth tenaculum was removed from the cervix.  The tenaculum sites and cervix were noted to be  Hemostatic before removing the operative speculum.  Sponge needle and instrument counts were corrects times two.  The patient tolerated the procedure well and was taken to the recovery room in stable  condition.

## 2017-03-22 NOTE — H&P (Signed)
Date of Initial H&P: 03/14/2017  History reviewed, patient examined, no change in status, stable for surgery.

## 2017-03-22 NOTE — OR Nursing (Signed)
Discharge instructions discussed with pt and husband. Both voiced understanding.

## 2017-03-23 ENCOUNTER — Encounter: Payer: Self-pay | Admitting: Obstetrics and Gynecology

## 2017-03-27 ENCOUNTER — Ambulatory Visit: Payer: Self-pay | Admitting: Nurse Practitioner

## 2017-03-28 NOTE — Anesthesia Postprocedure Evaluation (Signed)
Anesthesia Post Note  Patient: Carolyn Edwards  Procedure(s) Performed: FOREIGN BODY REMOVAL (N/A Uterus)  Patient location during evaluation: PACU Anesthesia Type: General Level of consciousness: awake and alert Pain management: pain level controlled Vital Signs Assessment: post-procedure vital signs reviewed and stable Respiratory status: spontaneous breathing, nonlabored ventilation, respiratory function stable and patient connected to nasal cannula oxygen Cardiovascular status: blood pressure returned to baseline and stable Postop Assessment: no apparent nausea or vomiting Anesthetic complications: no     Last Vitals:  Vitals:   03/22/17 1600 03/22/17 1622  BP: 104/70 102/71  Pulse: (!) 59 (!) 52  Resp: 20   Temp: 36.7 C   SpO2: 99% 100%    Last Pain:  Vitals:   03/22/17 1600  PainSc: 0-No pain                 Christia ReadingScott T Mont Jagoda

## 2017-05-17 ENCOUNTER — Telehealth: Payer: Self-pay

## 2017-05-17 NOTE — Telephone Encounter (Signed)
Pt has questions about her new bc.  [phone].  Left msg to call back and leave her specific questions on nurse line.

## 2017-05-22 NOTE — Telephone Encounter (Signed)
Pt hasn't returned call.  Msg closed. 

## 2017-06-11 NOTE — Telephone Encounter (Signed)
Pt calling with question about her birth control. [phone]

## 2017-06-12 NOTE — Telephone Encounter (Signed)
Spoke w/pt. She had IUD surgically removed in February and was put on Aygestin 5 mg. Pt states she hasn't had a period and is feeling bloated. She states she was likely distracted by thinking about the surgery when AMS discussed how this med works and is just inquiring if normal to not have menses.

## 2017-06-25 IMAGING — CT CT HEAD W/O CM
3 series · 15 of 46 positions shown, 18 images · non-contrast
Comparison: 05/13/2015 brain MRI

CLINICAL DATA: Headache since last [REDACTED].  History of migraines

EXAM:
CT HEAD WITHOUT CONTRAST
TECHNIQUE: Contiguous axial images were obtained from the base of the skull
through the vertex without intravenous contrast.

[Series 2: head wo · axial · 0.42mm/px · z∈[-166,-46]mm · 9 of 29 slices shown, 12 images]
[im 3/29  brain]
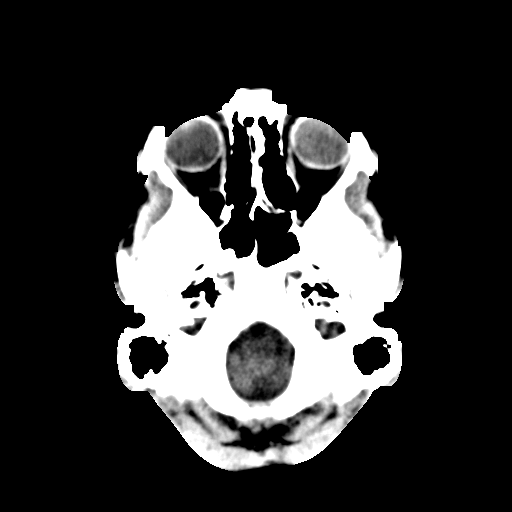
[im 3/29  bone]
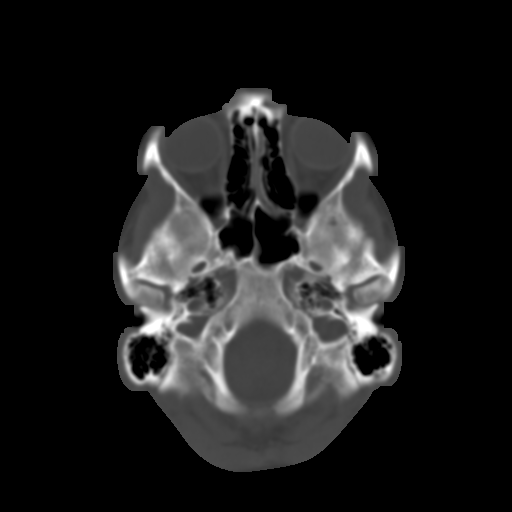
[im 6/29  brain]
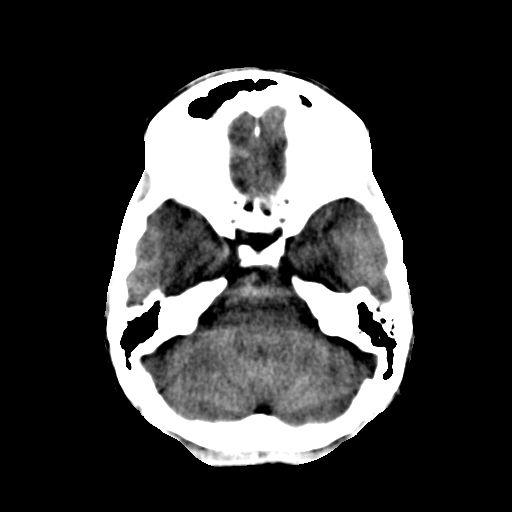
[im 9/29  brain]
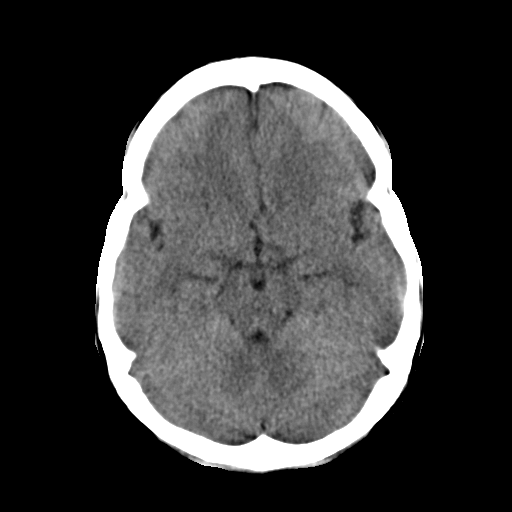
[im 12/29  brain]
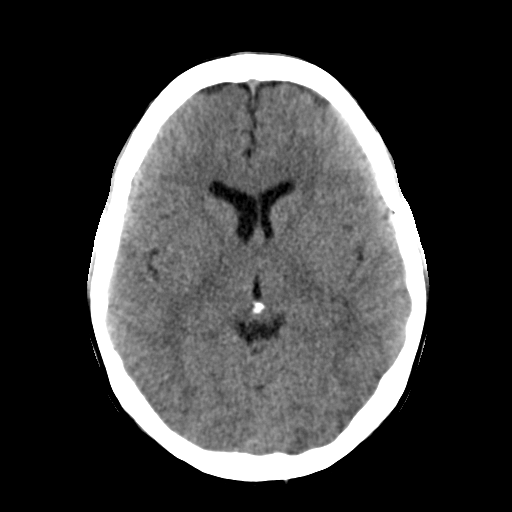
[im 15/29  brain]
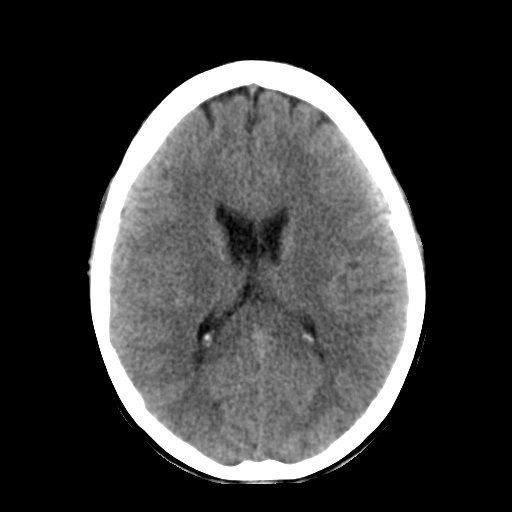
[im 15/29  bone]
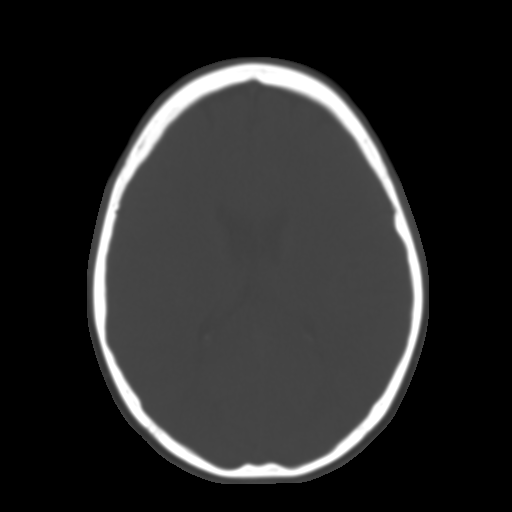
[im 18/29  brain]
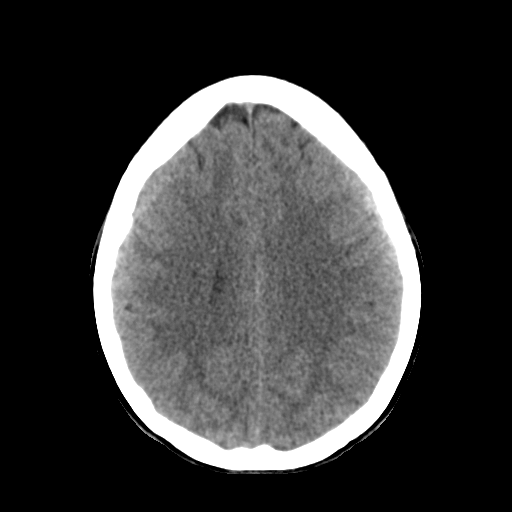
[im 21/29  brain]
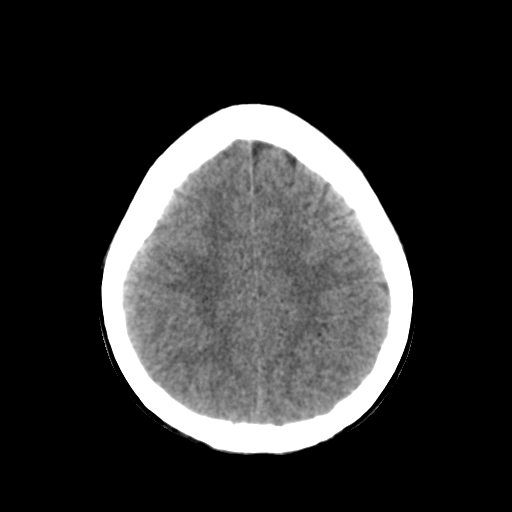
[im 24/29  brain]
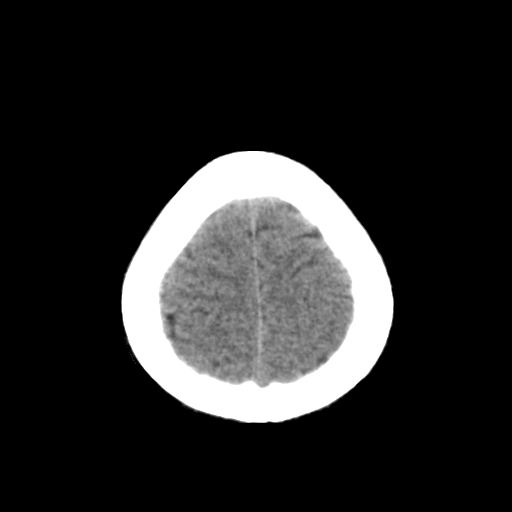
[im 27/29  brain]
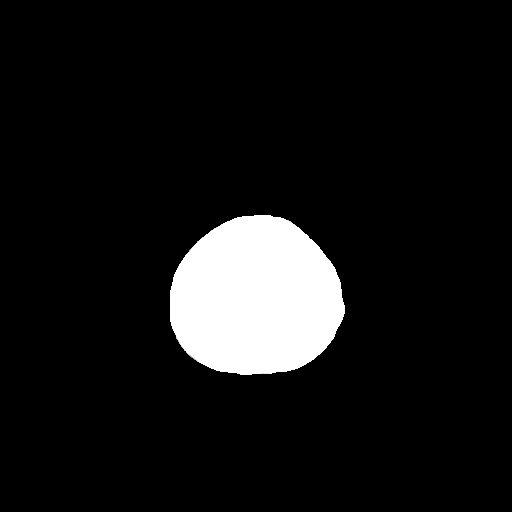
[im 27/29  bone]
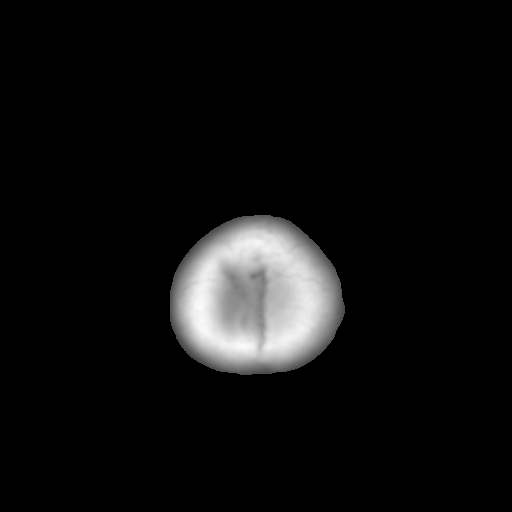

[Series 4: coronal soft · coronal · 0.30mm/px · 3 of 58 slices shown]
[im 20/58  brain]
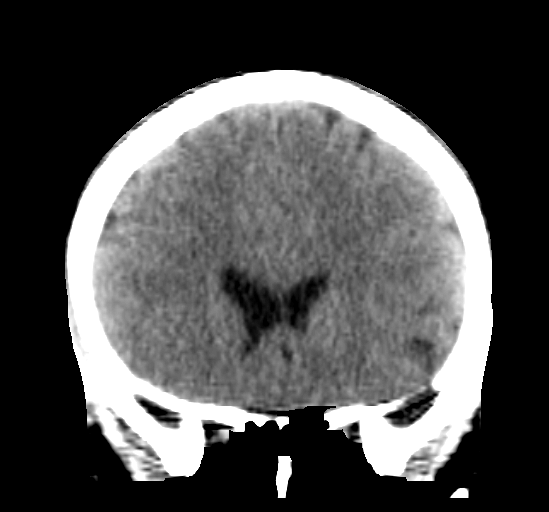
[im 26/58  brain]
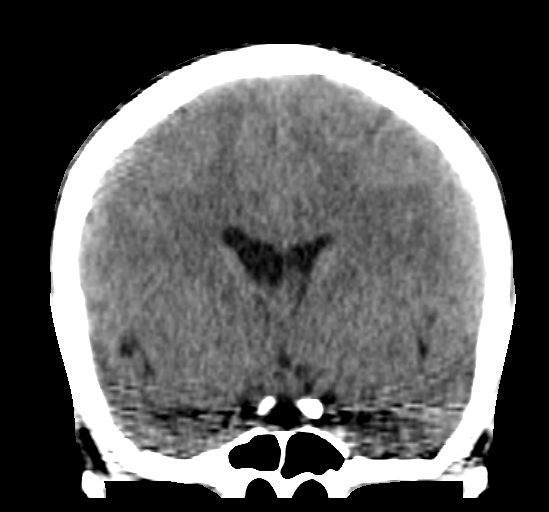
[im 32/58  brain]
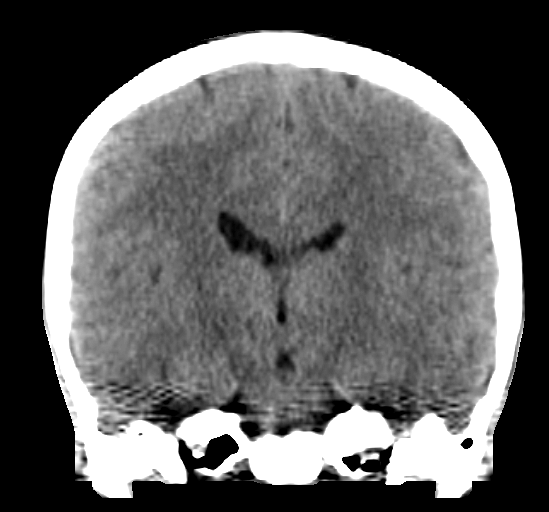

[Series 5: sagittal soft · sagittal · 0.28mm/px · 3 of 49 slices shown]
[im 17/49  brain]
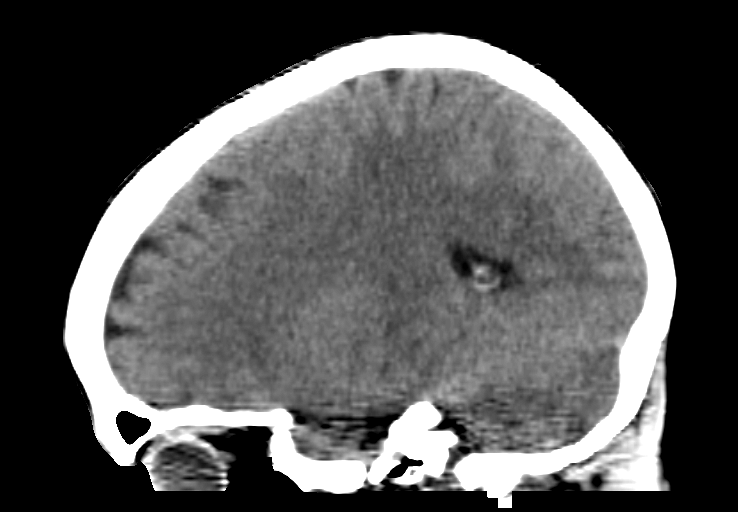
[im 25/49  brain]
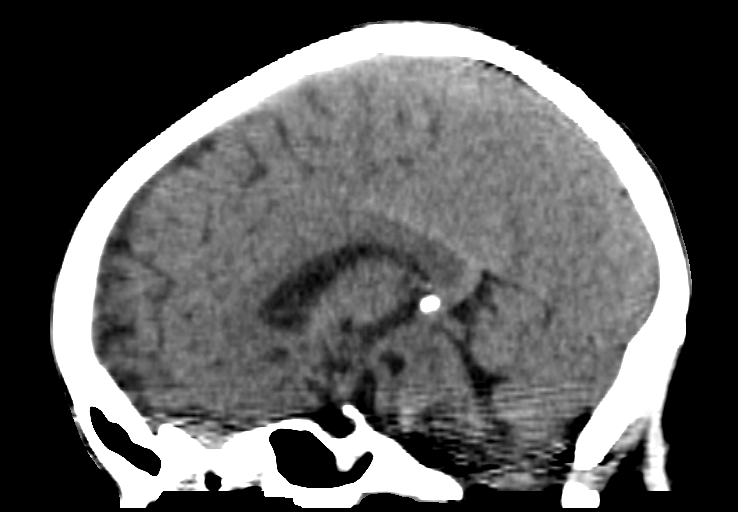
[im 33/49  brain]
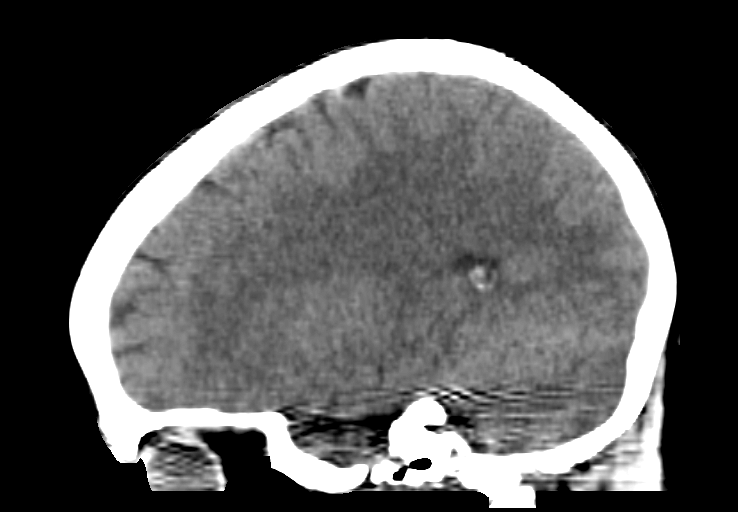

[15 of 46 positions shown; findings below may reference images not displayed]

FINDINGS: Brain: Normal. No acute or remote infarction, hemorrhage,
hydrocephalus, or mass lesion/mass effect.

Vascular: No hyperdense vessel or unexpected calcification.

Skull: Negative for fracture or focal lesion.

Sinuses/Orbits: No acute finding.

Other: None.
IMPRESSION: Negative head CT.

## 2017-08-13 ENCOUNTER — Telehealth: Payer: Self-pay

## 2017-08-13 NOTE — Telephone Encounter (Signed)
Pt is due for an annual. Please call her and schedule and she can discuss concerns with AMS at that time.

## 2017-08-13 NOTE — Telephone Encounter (Signed)
Pt has been on bcp for a few months now, doesn't think body isn't taking it well, still has terrible cramps, no missed pills, and bleeds every other week.  [phone]

## 2017-08-14 NOTE — Telephone Encounter (Signed)
Patient is schedule for medication follow up 08/14/17 with AMS> [facility]

## 2017-08-15 ENCOUNTER — Ambulatory Visit (INDEPENDENT_AMBULATORY_CARE_PROVIDER_SITE_OTHER): Payer: Medicaid Other | Admitting: Obstetrics and Gynecology

## 2017-08-15 ENCOUNTER — Encounter: Payer: Self-pay | Admitting: Obstetrics and Gynecology

## 2017-08-15 VITALS — BP 100/78 | HR 64 | Ht 64.0 in | Wt 140.0 lb

## 2017-08-15 DIAGNOSIS — N939 Abnormal uterine and vaginal bleeding, unspecified: Secondary | ICD-10-CM | POA: Diagnosis not present

## 2017-08-15 NOTE — Progress Notes (Signed)
Obstetrics & Gynecology Office Visit   Chief Complaint:  Chief Complaint  Patient presents with  . Follow-up     Irregular bleeding on OCP    History of Present Illness: 32 y.o. G1P1001 presenting for medication follow up of norethindrone for contraception and cycle control, in setting of migraines and Mirena IUD which required surgical removal..  The patient reports acute worsening in symptoms.  On her current medication regimen has had poor cycle control with irregular or heavy periods.   She has not noted any additional side-effects or new symptoms, although she did note unusual dreams on starting norethindrone which improved after switching from nighttime administration to morning administration.    She is interested in management option for better cycle control while also providing adequate contraception.  Review of Systems: Review of systems negative unless otherwise noted in HPI  Past Medical History:  Past Medical History:  Diagnosis Date  . Dysmetabolic syndrome X 03/23/2011  . Genital warts   . LGSIL on Pap smear of cervix 2006  . Migraine   . Ovarian cyst 11/23/2015  . Thyroid nodule     Past Surgical History:  Past Surgical History:  Procedure Laterality Date  . BREAST ENHANCEMENT SURGERY    . COLPOSCOPY  01/2005   bxs neg  . FOREIGN BODY REMOVAL N/A 03/22/2017   Procedure: FOREIGN BODY REMOVAL;  Surgeon: Vena Austria, MD;  Location: [facility] ORS;  Service: Gynecology;  Laterality: N/A;  . INTRAUTERINE DEVICE (IUD) INSERTION  08/2006   Mirena    Gynecologic History: No LMP recorded.  Obstetric History: G1P1001  Family History:  Family History  Problem Relation Age of Onset  . Diabetes Mother   . Colon cancer Maternal Grandfather 68    Social History:  Social History   Socioeconomic History  . Marital status: Married    Spouse name: Not on file  . Number of children: Not on file  . Years of education: Not on file  . Highest education level:  Not on file  Occupational History  . Not on file  Social Needs  . Financial resource strain: Not on file  . Food insecurity:    Worry: Not on file    Inability: Not on file  . Transportation needs:    Medical: Not on file    Non-medical: Not on file  Tobacco Use  . Smoking status: Never Smoker  . Smokeless tobacco: Never Used  Substance and Sexual Activity  . Alcohol use: No  . Drug use: No  . Sexual activity: Yes    Birth control/protection: IUD  Lifestyle  . Physical activity:    Days per week: 3 days    Minutes per session: 30 min  . Stress: Only a little  Relationships  . Social connections:    Talks on phone: More than three times a week    Gets together: Twice a week    Attends religious service: Never    Active member of club or organization: No    Attends meetings of clubs or organizations: Never    Relationship status: Married  . Intimate partner violence:    Fear of current or ex partner: No    Emotionally abused: No    Physically abused: No    Forced sexual activity: No  Other Topics Concern  . Not on file  Social History Narrative  . Not on file    Allergies:  Allergies  Allergen Reactions  . Amoxicillin Nausea And Vomiting  dizziness Has patient had a PCN reaction causing immediate rash, facial/tongue/throat swelling, SOB or lightheadedness with hypotension: No Has patient had a PCN reaction causing severe rash involving mucus membranes or skin necrosis: No Has patient had a PCN reaction that required hospitalization: No Has patient had a PCN reaction occurring within the last 10 years: Yes If all of the above answers are "NO", then may proceed with Cephalosporin use.     Medications: Prior to Admission medications   Medication Sig Start Date End Date Taking? Authorizing Provider  amphetamine-dextroamphetamine (ADDERALL) 10 MG tablet Take 10 mg by mouth daily as needed (focus).  02/08/17  Yes [provider]    Aspirin-Acetaminophen-Caffeine (GOODY HEADACHE PO) Take 1 packet by mouth daily as needed (headaches).   Yes [provider]  ibuprofen (ADVIL,MOTRIN) 600 MG tablet Take 1 tablet (600 mg total) by mouth every 6 (six) hours as needed. 03/22/17  Yes Vena AustriaStaebler, Dania Marsan, MD  Multiple Vitamins-Minerals (HAIR SKIN AND NAILS FORMULA) TABS Take 3 tablets by mouth daily.   Yes [provider]  norethindrone (AYGESTIN) 5 MG tablet Take 1 tablet (5 mg total) by mouth daily. 03/22/17  Yes Vena AustriaStaebler, Ambyr Qadri, MD    Physical Exam Vitals:  Vitals:   08/15/17 0912  BP: 100/78  Pulse: 64   No LMP recorded.  General: NAD HEENT: normocephalic, anicteric Pulmonary: No increased work of breathing Neurologic: Grossly intact Psychiatric: mood appropriate, affect full  Assessment: 32 y.o. G1P1001 presenting for medication follow up, abnormal uterine bleeding  Plan: Problem List Items Addressed This Visit    None    Visit Diagnoses    Abnormal uterine bleeding    -  Primary     1) AUB-I - Most recent ultrasound 2017 no uterine structural abnormalities present - Discussed discontinuing norethindrone to see if cycles regulate.  Barrier back up stressed during this time frame - If improvement noted conisder lower dose norethindrone, IUD, or low dose combination OCP but history of migraines so I discussed CDC and WHO recommendations against estrogen constraining contraceptives. - not interested in conceiving in the near futures  2) A total of 15 minutes were spent in face-to-face contact with the patient during this encounter with over half of that time devoted to counseling and coordination of care.  3) Return in about 1 year (around 08/16/2018), or if symptoms worsen or fail to improve, for annual.   Vena AustriaAndreas Tayshawn Purnell, MD, Merlinda FrederickFACOG [facility], [facility]

## 2017-08-20 ENCOUNTER — Telehealth: Payer: Self-pay

## 2017-08-20 NOTE — Telephone Encounter (Signed)
Yes, I sent in staff message about appt history.

## 2017-08-21 ENCOUNTER — Encounter: Payer: Self-pay | Admitting: Adult Health

## 2017-08-21 ENCOUNTER — Ambulatory Visit: Payer: Medicaid Other | Admitting: Adult Health

## 2017-08-21 ENCOUNTER — Other Ambulatory Visit: Payer: Self-pay | Admitting: Adult Health

## 2017-08-21 VITALS — BP 100/68 | HR 73 | Temp 97.7°F | Resp 16 | Ht 64.0 in | Wt 139.0 lb

## 2017-08-21 DIAGNOSIS — B001 Herpesviral vesicular dermatitis: Secondary | ICD-10-CM

## 2017-08-21 MED ORDER — VALACYCLOVIR HCL 1 G PO TABS: 1000.0000 mg | ORAL_TABLET | Freq: Two times a day (BID) | ORAL | 1 refills | Status: DC

## 2017-08-21 NOTE — Progress Notes (Signed)
[facility] [address] [place], [place] [address]  Internal MEDICINE  Office Visit Note  Patient Name: Carolyn Edwards  [id number]  [id number]  Date of Service: 08/26/2017  Chief Complaint  Patient presents with  . Blister    fever blister on lip     HPI Pt is here for a sick visit. She reports hx of fever blisters.  She has just returned from a cruise in the [place].  She reports she did not have any medication to take.    Current Medication:  Outpatient Encounter Medications as of 08/21/2017  Medication Sig Note  . amphetamine-dextroamphetamine (ADDERALL) 10 MG tablet Take 10 mg by mouth daily as needed (focus).  03/14/2017: As needed   . Aspirin-Acetaminophen-Caffeine (GOODY HEADACHE PO) Take 1 packet by mouth daily as needed (headaches).   Marland Kitchen ibuprofen (ADVIL,MOTRIN) 600 MG tablet Take 1 tablet (600 mg total) by mouth every 6 (six) hours as needed.   . Multiple Vitamins-Minerals (HAIR SKIN AND NAILS FORMULA) TABS Take 3 tablets by mouth daily.   . norethindrone (AYGESTIN) 5 MG tablet Take 1 tablet (5 mg total) by mouth daily.   . valACYclovir (VALTREX) 1000 MG tablet Take 1 tablet (1,000 mg total) by mouth 2 (two) times daily.    No facility-administered encounter medications on file as of 08/21/2017.    Medical History: Past Medical History:  Diagnosis Date  . Dysmetabolic syndrome X 03/23/2011  . Genital warts   . LGSIL on Pap smear of cervix 2006  . Migraine   . Ovarian cyst 11/23/2015  . Thyroid nodule    Vital Signs: BP 100/68   Pulse 73   Temp 97.7 F (36.5 C)   Resp 16   Ht 5\' 4"  (1.626 m)   Wt 139 lb (63 kg)   SpO2 99%   BMI 23.86 kg/m    Review of Systems  Constitutional: Negative for chills, fatigue and unexpected weight change.  HENT: Negative for congestion, rhinorrhea, sneezing and sore throat.   Eyes: Negative for photophobia, pain and redness.  Respiratory: Negative for cough, chest tightness and shortness of breath.    Cardiovascular: Negative for chest pain and palpitations.  Gastrointestinal: Negative for abdominal pain, constipation, diarrhea, nausea and vomiting.  Endocrine: Negative.   Genitourinary: Negative for dysuria and frequency.  Musculoskeletal: Negative for arthralgias, back pain, joint swelling and neck pain.  Skin: Negative for rash.  Allergic/Immunologic: Negative.   Neurological: Negative for tremors and numbness.  Hematological: Negative for adenopathy. Does not bruise/bleed easily.  Psychiatric/Behavioral: Negative for behavioral problems and sleep disturbance. The patient is not nervous/anxious.    Physical Exam  Constitutional: She is oriented to person, place, and time. She appears well-developed and well-nourished. No distress.  HENT:  Head: Normocephalic and atraumatic.  Mouth/Throat: Oropharynx is clear and moist. No oropharyngeal exudate.  Eyes: Pupils are equal, round, and reactive to light. EOM are normal.  Neck: Normal range of motion. Neck supple. No JVD present. No tracheal deviation present. No thyromegaly present.  Cardiovascular: Normal rate, regular rhythm and normal heart sounds. Exam reveals no gallop and no friction rub.  No murmur heard. Pulmonary/Chest: Effort normal and breath sounds normal. No respiratory distress. She has no wheezes. She has no rales. She exhibits no tenderness.  Abdominal: Soft. There is no tenderness. There is no guarding.  Musculoskeletal: Normal range of motion.  Lymphadenopathy:    She has no cervical adenopathy.  Neurological: She is alert and oriented to person, place, and time.  No cranial nerve deficit.  Skin: Skin is warm and dry. She is not diaphoretic.  Psychiatric: She has a normal mood and affect. Her behavior is normal. Judgment and thought content normal.  Nursing note and vitals reviewed.   Assessment/Plan: 1. Fever blister Use Valtrex as discussed.  Return if symptoms worsen or are unchanged by medication.  -  valACYclovir (VALTREX) 1000 MG tablet; Take 1 tablet (1,000 mg total) by mouth 2 (two) times daily.  Dispense: 20 tablet; Refill: 1  General Counseling: Morrie Sheldonshley verbalizes understanding of the findings of todays visit and agrees with plan of treatment. I have discussed any further diagnostic evaluation that may be needed or ordered today. We also reviewed her medications today. she has been encouraged to call the office with any questions or concerns that should arise related to todays visit.    Meds ordered this encounter  Medications  . valACYclovir (VALTREX) 1000 MG tablet    Sig: Take 1 tablet (1,000 mg total) by mouth 2 (two) times daily.    Dispense:  20 tablet    Refill:  1    Time spent: 20 Minutes  This patient was seen by Blima LedgerAdam Nayan Proch AGNP-C in Collaboration with Dr Lyndon CodeFozia M Khan as a part of collaborative care agreement

## 2017-08-21 NOTE — Patient Instructions (Signed)
Cold Sore A cold sore, also called a fever blister, is a skin infection that is caused by a virus. This infection causes small, fluid-filled sores to form inside of the mouth or on the lips, gums, nose, chin, or cheeks. Cold sores can spread to other parts of the body, such as the eyes or fingers. Cold sores can be spread or passed from person to person (contagious) until the sores crust over completely. Cold sores can be spread through close contact, such as kissing or sharing a drinking glass. Follow these instructions at home: Medicines  Take or apply over-the-counter and prescription medicines only as told by your doctor.  Use a cotton-tip swab to apply creams or gels to your sores. Sore Care  Do not touch the sores or pick the scabs.  Wash your hands often. Do not touch your eyes without washing your hands first.  Keep the sores clean and dry.  If directed, apply ice to the sores:  Put ice in a plastic bag.  Place a towel between your skin and the bag.  Leave the ice on for 20 minutes, 2-3 times per day. Lifestyle  Do not kiss, have oral sex, or share personal items until your sores heal.  Eat a soft, bland diet. Avoid eating hot, cold, or salty foods. These can hurt your mouth.  Use a straw if it hurts to drink out of a glass.  Avoid the sun and limit your stress if these things trigger outbreaks. If sun causes cold sores, apply sunscreen on your lips before being out in the sun. Contact a doctor if:  You have symptoms for more than two weeks.  You have pus coming from the sores.  You have redness that is spreading.  You have pain or irritation in your eye.  You get sores on your genitals.  Your sores do not heal within two weeks.  You get cold sores often. Get help right away if:  You have a fever and your symptoms suddenly get worse.  You have a headache and confusion. This information is not intended to replace advice given to you by your health care  provider. Make sure you discuss any questions you have with your health care provider. Document Released: 07/18/2011 Document Revised: 06/24/2015 Document Reviewed: 11/06/2014 Elsevier Interactive Patient Education  2018 Elsevier Inc.  

## 2017-10-25 ENCOUNTER — Ambulatory Visit: Payer: Medicaid Other | Admitting: Nurse Practitioner

## 2017-10-25 ENCOUNTER — Encounter: Payer: Self-pay | Admitting: Nurse Practitioner

## 2017-10-25 VITALS — BP 122/79 | HR 94 | Resp 16 | Ht 65.0 in | Wt 140.6 lb

## 2017-10-25 DIAGNOSIS — R5383 Other fatigue: Secondary | ICD-10-CM

## 2017-10-25 DIAGNOSIS — R41840 Attention and concentration deficit: Secondary | ICD-10-CM | POA: Diagnosis not present

## 2017-10-25 MED ORDER — AMPHETAMINE-DEXTROAMPHETAMINE 10 MG PO TABS: 10.0000 mg | ORAL_TABLET | Freq: Every day | ORAL | 0 refills | Status: DC | PRN

## 2017-10-25 NOTE — Progress Notes (Signed)
[facility] [address] [place], [place] [address]  Internal MEDICINE  Office Visit Note  Patient Name: Carolyn Edwards  [id number]  [id number]  Date of Service: 10/25/2017  Chief Complaint  Patient presents with  . Medical Management of Chronic Issues    medication refill. pt had iud surgically removed since last visit    The patient is here to have refills of her adderall 10mg  tablets. Takes this very intermittently. Last prescription was in 03/2017. She does well at work and stays focused and on track while taking this medication. She has no negative side effects to report.       Current Medication: Outpatient Encounter Medications as of 10/25/2017  Medication Sig Note  . amphetamine-dextroamphetamine (ADDERALL) 10 MG tablet Take 1 tablet (10 mg total) by mouth daily as needed (focus).   . Aspirin-Acetaminophen-Caffeine (GOODY HEADACHE PO) Take 1 packet by mouth daily as needed (headaches).   Marland Kitchen ibuprofen (ADVIL,MOTRIN) 600 MG tablet Take 1 tablet (600 mg total) by mouth every 6 (six) hours as needed.   . Multiple Vitamins-Minerals (HAIR SKIN AND NAILS FORMULA) TABS Take 3 tablets by mouth daily.   . norethindrone (AYGESTIN) 5 MG tablet Take 1 tablet (5 mg total) by mouth daily.   . valACYclovir (VALTREX) 1000 MG tablet Take 1 tablet (1,000 mg total) by mouth 2 (two) times daily.   . [DISCONTINUED] amphetamine-dextroamphetamine (ADDERALL) 10 MG tablet Take 10 mg by mouth daily as needed (focus).  03/14/2017: As needed   . [DISCONTINUED] amphetamine-dextroamphetamine (ADDERALL) 10 MG tablet Take 1 tablet (10 mg total) by mouth daily as needed (focus).   . [DISCONTINUED] amphetamine-dextroamphetamine (ADDERALL) 10 MG tablet Take 1 tablet (10 mg total) by mouth daily as needed (focus).    No facility-administered encounter medications on file as of 10/25/2017.     Surgical History: Past Surgical History:  Procedure Laterality Date  . BREAST ENHANCEMENT SURGERY     . COLPOSCOPY  01/2005   bxs neg  . FOREIGN BODY REMOVAL N/A 03/22/2017   Procedure: FOREIGN BODY REMOVAL;  Surgeon: Vena Austria, MD;  Location: [facility] ORS;  Service: Gynecology;  Laterality: N/A;  . INTRAUTERINE DEVICE (IUD) INSERTION  08/2006   Mirena    Medical History: Past Medical History:  Diagnosis Date  . Dysmetabolic syndrome X 03/23/2011  . Genital warts   . LGSIL on Pap smear of cervix 2006  . Migraine   . Ovarian cyst 11/23/2015  . Thyroid nodule     Family History: Family History  Problem Relation Age of Onset  . Diabetes Mother   . Colon cancer Maternal Grandfather 21    Social History   Socioeconomic History  . Marital status: Married    Spouse name: Not on file  . Number of children: Not on file  . Years of education: Not on file  . Highest education level: Not on file  Occupational History  . Not on file  Social Needs  . Financial resource strain: Not on file  . Food insecurity:    Worry: Not on file    Inability: Not on file  . Transportation needs:    Medical: Not on file    Non-medical: Not on file  Tobacco Use  . Smoking status: Never Smoker  . Smokeless tobacco: Never Used  Substance and Sexual Activity  . Alcohol use: No  . Drug use: No  . Sexual activity: Not Currently    Birth control/protection: IUD    Comment: pt has IUD  surgically removed  Lifestyle  . Physical activity:    Days per week: 3 days    Minutes per session: 30 min  . Stress: Only a little  Relationships  . Social connections:    Talks on phone: More than three times a week    Gets together: Twice a week    Attends religious service: Never    Active member of club or organization: No    Attends meetings of clubs or organizations: Never    Relationship status: Married  . Intimate partner violence:    Fear of current or ex partner: No    Emotionally abused: No    Physically abused: No    Forced sexual activity: No  Other Topics Concern  . Not on file   Social History Narrative  . Not on file      Review of Systems  Constitutional: Negative for activity change, chills, fatigue and unexpected weight change.  HENT: Negative for congestion, rhinorrhea, sneezing and sore throat.   Eyes: Negative.  Negative for photophobia, pain and redness.  Respiratory: Negative for cough, chest tightness and shortness of breath.   Cardiovascular: Negative for chest pain and palpitations.  Gastrointestinal: Negative for abdominal pain, constipation, diarrhea, nausea and vomiting.  Endocrine: Negative for cold intolerance, heat intolerance, polydipsia, polyphagia and polyuria.  Genitourinary: Negative for dysuria and frequency.  Musculoskeletal: Negative for arthralgias, back pain, joint swelling and neck pain.  Skin: Negative for rash.  Allergic/Immunologic: Negative for environmental allergies.  Neurological: Negative for dizziness, tremors, numbness and headaches.  Hematological: Negative for adenopathy. Does not bruise/bleed easily.  Psychiatric/Behavioral: Positive for decreased concentration. Negative for behavioral problems and sleep disturbance. The patient is not nervous/anxious.    Today's Vitals   10/25/17 1409  BP: 122/79  Pulse: 94  Resp: 16  SpO2: 100%  Weight: 140 lb 9.6 oz (63.8 kg)  Height: 5\' 5"  (1.651 m)    Physical Exam  Constitutional: She is oriented to person, place, and time. She appears well-developed and well-nourished. No distress.  HENT:  Head: Normocephalic and atraumatic.  Nose: Nose normal.  Mouth/Throat: Oropharynx is clear and moist. No oropharyngeal exudate.  Eyes: Pupils are equal, round, and reactive to light. Conjunctivae and EOM are normal.  Neck: Normal range of motion. Neck supple. No JVD present. No tracheal deviation present. No thyromegaly present.  Cardiovascular: Normal rate, regular rhythm and normal heart sounds. Exam reveals no gallop and no friction rub.  No murmur heard. Pulmonary/Chest:  Effort normal and breath sounds normal. No respiratory distress. She has no wheezes. She has no rales. She exhibits no tenderness.  Abdominal: Soft. Bowel sounds are normal. There is no tenderness.  Musculoskeletal: Normal range of motion.  Lymphadenopathy:    She has no cervical adenopathy.  Neurological: She is alert and oriented to person, place, and time. No cranial nerve deficit.  Skin: Skin is warm and dry. Capillary refill takes less than 2 seconds. She is not diaphoretic.  Psychiatric: She has a normal mood and affect. Her behavior is normal. Judgment and thought content normal.  Nursing note and vitals reviewed.  Assessment/Plan: 1. Other fatigue Likely related to job stress. Will monitor.   2. Attention and concentration deficit May continue adderall 10mg  daily when needed. Three 30 day prescriptions provided. Dates are 10/25/2017, 11/21/2017, and 12/20/2017 - amphetamine-dextroamphetamine (ADDERALL) 10 MG tablet; Take 1 tablet (10 mg total) by mouth daily as needed (focus).  Dispense: 30 tablet; Refill: 0  General Counseling: Morrie Sheldon verbalizes understanding  of the findings of todays visit and agrees with plan of treatment. I have discussed any further diagnostic evaluation that may be needed or ordered today. We also reviewed her medications today. she has been encouraged to call the office with any questions or concerns that should arise related to todays visit.  Refilled Controlled medications today. Reviewed risks and possible side effects associated with taking Stimulants. Combination of these drugs with other psychotropic medications could cause dizziness and drowsiness. Pt needs to Monitor symptoms and exercise caution in driving and operating heavy machinery to avoid damages to oneself, to others and to the surroundings. Patient verbalized understanding in this matter. Dependence and abuse for these drugs will be monitored closely. A Controlled substance policy and procedure is  on file which allows [facility] to order a urine drug screen test at any visit. Patient understands and agrees with the plan..  This patient was seen by Vincent Gros FNP Collaboration with Dr Lyndon Code as a part of collaborative care agreement   Meds ordered this encounter  Medications  . DISCONTD: amphetamine-dextroamphetamine (ADDERALL) 10 MG tablet    Sig: Take 1 tablet (10 mg total) by mouth daily as needed (focus).    Dispense:  30 tablet    Refill:  0    Order Specific Question:   Supervising Provider    Answer:   Lyndon Code [[id number]]  . DISCONTD: amphetamine-dextroamphetamine (ADDERALL) 10 MG tablet    Sig: Take 1 tablet (10 mg total) by mouth daily as needed (focus).    Dispense:  30 tablet    Refill:  0    Fill after 11/21/2017    Order Specific Question:   Supervising Provider    Answer:   Lyndon Code [[id number]]  . amphetamine-dextroamphetamine (ADDERALL) 10 MG tablet    Sig: Take 1 tablet (10 mg total) by mouth daily as needed (focus).    Dispense:  30 tablet    Refill:  0    Fill after 12/20/2017    Order Specific Question:   Supervising Provider    Answer:   Lyndon Code [[id number]]    Time spent: 38 Minutes      Dr Lyndon Code Internal medicine

## 2017-12-19 ENCOUNTER — Ambulatory Visit: Payer: Medicaid Other | Admitting: Adult Health

## 2017-12-19 ENCOUNTER — Other Ambulatory Visit: Payer: Self-pay | Admitting: Adult Health

## 2017-12-19 ENCOUNTER — Encounter: Payer: Self-pay | Admitting: Adult Health

## 2017-12-19 VITALS — BP 119/81 | HR 78 | Temp 97.8°F | Resp 16 | Ht 64.0 in | Wt 140.0 lb

## 2017-12-19 DIAGNOSIS — R41840 Attention and concentration deficit: Secondary | ICD-10-CM | POA: Diagnosis not present

## 2017-12-19 DIAGNOSIS — K099 Cyst of oral region, unspecified: Secondary | ICD-10-CM

## 2017-12-19 NOTE — Patient Instructions (Signed)
Mucocele of the Mouth A mucocele is a growth or bump (cyst) that is filled with mucus. A mucocele can form on various parts of the mouth, including the gums, the tongue, and the inside of the cheeks. A common spot is inside the lower lip. Mucoceles are not dangerous, and they are usually not painful. A mucocele can be uncomfortable if it is very large or if it is located under the tongue. Small mucoceles often clear up on their own. Treatment may not be needed. Mucoceles that are bigger or that keep coming back might need to be removed. What are the causes? Mucoceles form when the salivary ducts in the mouth are damaged and leak saliva. These ducts carry saliva from the salivary glands to the surface of the mouth. A mucocele can develop because of:  An injury to your mouth.  Sucking or biting on your lips or tongue.  A blocked salivary duct. This is sometimes caused by swelling.  Piercing of the tongue or lip for jewelry.  In some cases, the cause may not be known. What are the signs or symptoms? Symptoms of this condition include a smooth, painless bumpinside your mouth. The bump may:  Show up all of a sudden.  Have thin walls and a bluish color.  Change in size. Most are smaller than  inch.  Mucoceles under the tongue are called ranulas. These may be bigger and may push your tongue up and back. In some cases, this can make it hard to talk, swallow, or breathe. How is this diagnosed? This condition is usually diagnosed with a physical exam. Your health care provider will often be able to tell if you have a mucocele by looking at it and feeling it. You may also have tests, such as:  An ultrasound to check for problems with your salivary gland.  An X-ray to see if stones are blocking the exit of saliva.  How is this treated? Treatment may depend on the size of the mucocele:  For a small mucocele, treatment is usually not needed. It will drain on its own and go away.  For larger  mucoceles and ranulas, surgery may be needed. This may be done if the mucocele does not go away or if it keeps coming back. The entire mucocele may be taken out. In some cases, the salivary gland may also be removed.  Follow these instructions at home:  Take over-the-counter and prescription medicines only as told by your health care provider.  Do not try to drain a mucocele on your own. Do not poke a hole in it.  Do not use any tobacco products, such as cigarettes, chewing tobacco, and e-cigarettes. If you need help quitting, ask your health care provider.  Do not suck or bite on your lips or tongue.  If your mucocele was removed, avoid hard or spicy foods and foods that have high acidity while your mouth is healing.  Keep all follow-up visits as told by your health care provider. This is important. Contact a health care provider if:  You have a lump or cyst inside your mouth that does not go away.  You have a fever. Get help right away if:  You have a lump or cyst in your mouth that: ? Becomes painful. ? Gets large very fast.  You have a lump or cyst in your mouth that makes it hard to: ? Swallow. ? Talk. ? Breathe. This information is not intended to replace advice given to you by your   health care provider. Make sure you discuss any questions you have with your health care provider. Document Released: 02/12/2015 Document Revised: 06/24/2015 Document Reviewed: 04/13/2014 Elsevier Interactive Patient Education  2018 [employer]vier Inc[employer]  

## 2017-12-19 NOTE — Progress Notes (Signed)
[facility] [address] [place], [place] [address]  Internal MEDICINE  Office Visit Note  Patient Name: Carolyn Edwards  [id number] 09, 1987  [id number]  Date of Service: 12/19/2017  Chief Complaint  Patient presents with  . ADHD  . Other'    Bump under tongue and its red     HPI Pt is here for a sick visit.  She states that she noticed a small bump under her tongue at the base of her frenulum.  She denies any injury or trauma to the area.  She reports that she squeezed this bump and a small, white, hard, stone-like thing came out.  She denies any bleeding, discharge, or pain.  She states she can feel it under her tongue if that is getting bigger.     Current Medication:  Outpatient Encounter Medications as of 12/19/2017  Medication Sig  . amphetamine-dextroamphetamine (ADDERALL) 10 MG tablet Take 1 tablet (10 mg total) by mouth daily as needed (focus).  . Aspirin-Acetaminophen-Caffeine (GOODY HEADACHE PO) Take 1 packet by mouth daily as needed (headaches).  Marland Kitchen. ibuprofen (ADVIL,MOTRIN) 600 MG tablet Take 1 tablet (600 mg total) by mouth every 6 (six) hours as needed.  . Multiple Vitamins-Minerals (HAIR SKIN AND NAILS FORMULA) TABS Take 3 tablets by mouth daily.  . norethindrone (AYGESTIN) 5 MG tablet Take 1 tablet (5 mg total) by mouth daily.  . valACYclovir (VALTREX) 1000 MG tablet Take 1 tablet (1,000 mg total) by mouth 2 (two) times daily.   No facility-administered encounter medications on file as of 12/19/2017.       Medical History: Past Medical History:  Diagnosis Date  . Dysmetabolic syndrome X 03/23/2011  . Genital warts   . LGSIL on Pap smear of cervix 2006  . Migraine   . Ovarian cyst 11/23/2015  . Thyroid nodule      Vital Signs: BP 119/81   Pulse 78   Temp 97.8 F (36.6 C)   Resp 16   Ht 5\' 4"  (1.626 m)   Wt 140 lb (63.5 kg)   SpO2 98%   BMI 24.03 kg/m    Review of Systems  Constitutional: Negative for chills, fatigue and  unexpected weight change.  HENT: Positive for mouth sores. Negative for congestion, rhinorrhea, sneezing and sore throat.   Eyes: Negative for photophobia, pain and redness.  Respiratory: Negative for cough, chest tightness and shortness of breath.   Cardiovascular: Negative for chest pain and palpitations.  Gastrointestinal: Negative for abdominal pain, constipation, diarrhea, nausea and vomiting.  Endocrine: Negative.   Genitourinary: Negative for dysuria and frequency.  Musculoskeletal: Negative for arthralgias, back pain, joint swelling and neck pain.  Skin: Negative for rash.  Allergic/Immunologic: Negative.   Neurological: Negative for tremors and numbness.  Hematological: Negative for adenopathy. Does not bruise/bleed easily.  Psychiatric/Behavioral: Negative for behavioral problems and sleep disturbance. The patient is not nervous/anxious.     Physical Exam  Constitutional: She is oriented to person, place, and time. She appears well-developed and well-nourished. No distress.  HENT:  Head: Normocephalic and atraumatic.  Mouth/Throat: Oropharynx is clear and moist. No oropharyngeal exudate.  Small cyst at base of frenulum.    Eyes: Pupils are equal, round, and reactive to light. EOM are normal.  Neck: Normal range of motion. Neck supple. No JVD present. No tracheal deviation present. No thyromegaly present.  Cardiovascular: Normal rate, regular rhythm and normal heart sounds. Exam reveals no gallop and no friction rub.  No murmur heard. Pulmonary/Chest: Effort normal and  breath sounds normal. No respiratory distress. She has no wheezes. She has no rales. She exhibits no tenderness.  Abdominal: Soft. There is no tenderness. There is no guarding.  Musculoskeletal: Normal range of motion.  Lymphadenopathy:    She has no cervical adenopathy.  Neurological: She is alert and oriented to person, place, and time. No cranial nerve deficit.  Skin: Skin is warm and dry. She is not  diaphoretic.  Psychiatric: She has a normal mood and affect. Her behavior is normal. Judgment and thought content normal.  Nursing note and vitals reviewed.  Assessment/Plan: 1. Oral soft tissue cyst Patient reports this cyst has been present for approximately a week.  She constantly has an appoint with her dentist in the next week.  I instructed her to speak with the dentist about this and if he declined involvement we can refer her to ENT for evaluation.  She is agreeable to this plan and verbalizes that she will call back if she needs a referral for ENT.  2. Attention and concentration deficit Stable, continue current therapy.  General Counseling: ernestene coover understanding of the findings of todays visit and agrees with plan of treatment. I have discussed any further diagnostic evaluation that may be needed or ordered today. We also reviewed her medications today. she has been encouraged to call the office with any questions or concerns that should arise related to todays visit.   No orders of the defined types were placed in this encounter.   No orders of the defined types were placed in this encounter.   Time spent: 25 Minutes  This patient was seen by Blima Ledger AGNP-C in Collaboration with Dr Lyndon Code as a part of collaborative care agreement.  Johnna Acosta AGNP-C Internal Medicine

## 2017-12-20 LAB — CBC WITH DIFFERENTIAL/PLATELET
BASOS: 1 %
Basophils Absolute: 0.1 10*3/uL (ref 0.0–0.2)
EOS (ABSOLUTE): 1.3 10*3/uL — ABNORMAL HIGH (ref 0.0–0.4)
Eos: 21 %
Hematocrit: 39.4 % (ref 34.0–46.6)
Hemoglobin: 13.6 g/dL (ref 11.1–15.9)
IMMATURE GRANS (ABS): 0 10*3/uL (ref 0.0–0.1)
IMMATURE GRANULOCYTES: 0 %
LYMPHS: 23 %
Lymphocytes Absolute: 1.5 10*3/uL (ref 0.7–3.1)
MCH: 30.6 pg (ref 26.6–33.0)
MCHC: 34.5 g/dL (ref 31.5–35.7)
MCV: 89 fL (ref 79–97)
MONOCYTES: 5 %
Monocytes Absolute: 0.4 10*3/uL (ref 0.1–0.9)
NEUTROS ABS: 3.2 10*3/uL (ref 1.4–7.0)
NEUTROS PCT: 50 %
Platelets: 184 10*3/uL (ref 150–450)
RBC: 4.45 x10E6/uL (ref 3.77–5.28)
RDW: 11.8 % — ABNORMAL LOW (ref 12.3–15.4)
WBC: 6.4 10*3/uL (ref 3.4–10.8)

## 2017-12-20 LAB — IRON AND TIBC
IRON SATURATION: 44 % (ref 15–55)
Iron: 115 ug/dL (ref 27–159)
TIBC: 261 ug/dL (ref 250–450)
UIBC: 146 ug/dL (ref 131–425)

## 2017-12-20 LAB — FERRITIN: Ferritin: 137 ng/mL (ref 15–150)

## 2017-12-20 LAB — COMPREHENSIVE METABOLIC PANEL
A/G RATIO: 2.4 — AB (ref 1.2–2.2)
ALT: 12 IU/L (ref 0–32)
AST: 10 IU/L (ref 0–40)
Albumin: 4.8 g/dL (ref 3.5–5.5)
Alkaline Phosphatase: 57 IU/L (ref 39–117)
BUN/Creatinine Ratio: 23 (ref 9–23)
BUN: 17 mg/dL (ref 6–20)
Bilirubin Total: 0.3 mg/dL (ref 0.0–1.2)
CALCIUM: 9.3 mg/dL (ref 8.7–10.2)
CO2: 24 mmol/L (ref 20–29)
CREATININE: 0.73 mg/dL (ref 0.57–1.00)
Chloride: 100 mmol/L (ref 96–106)
GFR, EST AFRICAN AMERICAN: 128 mL/min/{1.73_m2} (ref >59)
GFR, EST NON AFRICAN AMERICAN: 111 mL/min/{1.73_m2} (ref >59)
Globulin, Total: 2 g/dL (ref 1.5–4.5)
Glucose: 115 mg/dL — ABNORMAL HIGH (ref 65–99)
Potassium: 4 mmol/L (ref 3.5–5.2)
Sodium: 139 mmol/L (ref 134–144)
TOTAL PROTEIN: 6.8 g/dL (ref 6.0–8.5)

## 2017-12-20 LAB — LIPID PANEL WITH LDL/HDL RATIO
Cholesterol, Total: 164 mg/dL (ref 100–199)
HDL: 82 mg/dL (ref >39)
LDL CALC: 68 mg/dL (ref 0–99)
LDL/HDL RATIO: 0.8 ratio (ref 0.0–3.2)
TRIGLYCERIDES: 68 mg/dL (ref 0–149)
VLDL CHOLESTEROL CAL: 14 mg/dL (ref 5–40)

## 2017-12-20 LAB — B12 AND FOLATE PANEL
Folate: 16.4 ng/mL (ref >3.0)
Vitamin B-12: 293 pg/mL (ref 232–1245)

## 2017-12-20 LAB — T4, FREE: FREE T4: 1.03 ng/dL (ref 0.82–1.77)

## 2017-12-20 LAB — TSH: TSH: 1.23 u[IU]/mL (ref 0.450–4.500)

## 2017-12-20 LAB — VITAMIN D 25 HYDROXY (VIT D DEFICIENCY, FRACTURES): Vit D, 25-Hydroxy: 37.5 ng/mL (ref 30.0–100.0)

## 2018-04-25 ENCOUNTER — Ambulatory Visit: Payer: Medicaid Other | Admitting: Adult Health

## 2018-04-25 ENCOUNTER — Encounter: Payer: Self-pay | Admitting: Nurse Practitioner

## 2018-04-25 ENCOUNTER — Other Ambulatory Visit: Payer: Self-pay

## 2018-04-25 VITALS — BP 119/88 | HR 66 | Resp 16 | Ht 65.0 in | Wt 142.0 lb

## 2018-04-25 DIAGNOSIS — Z79899 Other long term (current) drug therapy: Secondary | ICD-10-CM | POA: Diagnosis not present

## 2018-04-25 DIAGNOSIS — R41840 Attention and concentration deficit: Secondary | ICD-10-CM | POA: Diagnosis not present

## 2018-04-25 DIAGNOSIS — K099 Cyst of oral region, unspecified: Secondary | ICD-10-CM

## 2018-04-25 LAB — POCT URINE DRUG SCREEN
POC Amphetamine UR: NOT DETECTED
POC BENZODIAZEPINES UR: NOT DETECTED
POC Barbiturate UR: NOT DETECTED
POC COCAINE UR: NOT DETECTED
POC ECSTASY UR: NOT DETECTED
POC METHADONE UR: NOT DETECTED
POC Marijuana UR: NOT DETECTED
POC Methamphetamine UR: NOT DETECTED
POC Opiate Ur: NOT DETECTED
POC Oxycodone UR: NOT DETECTED
POC PHENCYCLIDINE UR: NOT DETECTED
POC TRICYCLICS UR: NOT DETECTED

## 2018-04-25 MED ORDER — AMPHETAMINE-DEXTROAMPHETAMINE 10 MG PO TABS: 10.0000 mg | ORAL_TABLET | Freq: Every day | ORAL | 0 refills | Status: DC

## 2018-04-25 MED ORDER — AMPHETAMINE-DEXTROAMPHETAMINE 10 MG PO TABS: 10.0000 mg | ORAL_TABLET | Freq: Every day | ORAL | 0 refills | Status: DC | PRN

## 2018-04-25 NOTE — Progress Notes (Signed)
[facility] [address] [place], [place] [address]  Internal MEDICINE  Office Visit Note  Patient Name: Carolyn Edwards  [id number]  [id number]  Date of Service: 04/25/2018  Chief Complaint  Patient presents with  . Medical Management of Chronic Issues    6 month routine follow up    HPI  Pt is here for follow up on ADD. Pt reports she is currently taking  of adderall daily as needed.  She reports taking it on average 4 times a week.  She reports good relief of symptoms, and she is able to focus on her tasks well.  Denies any chest pain, SOB, or other side effects of medication.     Current Medication: Outpatient Encounter Medications as of 04/25/2018  Medication Sig  . amphetamine-dextroamphetamine (ADDERALL) 10 MG tablet Take 1 tablet (10 mg total) by mouth daily as needed (focus).  . Aspirin-Acetaminophen-Caffeine (GOODY HEADACHE PO) Take 1 packet by mouth daily as needed (headaches).  Marland Kitchen ibuprofen (ADVIL,MOTRIN) 600 MG tablet Take 1 tablet (600 mg total) by mouth every 6 (six) hours as needed.  . Multiple Vitamins-Minerals (HAIR SKIN AND NAILS FORMULA) TABS Take 3 tablets by mouth daily.  . norethindrone (AYGESTIN) 5 MG tablet Take 1 tablet (5 mg total) by mouth daily.  . valACYclovir (VALTREX) 1000 MG tablet Take 1 tablet (1,000 mg total) by mouth 2 (two) times daily.  . [DISCONTINUED] amphetamine-dextroamphetamine (ADDERALL) 10 MG tablet Take 1 tablet (10 mg total) by mouth daily as needed (focus).  Melene Muller ON 05/25/2018] amphetamine-dextroamphetamine (ADDERALL) 10 MG tablet Take 1 tablet (10 mg total) by mouth daily with breakfast.   No facility-administered encounter medications on file as of 04/25/2018.     Surgical History: Past Surgical History:  Procedure Laterality Date  . BREAST ENHANCEMENT SURGERY    . COLPOSCOPY  01/2005   bxs neg  . FOREIGN BODY REMOVAL N/A 03/22/2017   Procedure: FOREIGN BODY REMOVAL;  Surgeon: Vena Austria, MD;   Location: [facility] ORS;  Service: Gynecology;  Laterality: N/A;  . INTRAUTERINE DEVICE (IUD) INSERTION  08/2006   Mirena    Medical History: Past Medical History:  Diagnosis Date  . Dysmetabolic syndrome X 03/23/2011  . Genital warts   . LGSIL on Pap smear of cervix 2006  . Migraine   . Ovarian cyst 11/23/2015  . Thyroid nodule     Family History: Family History  Problem Relation Age of Onset  . Diabetes Mother   . Colon cancer Maternal Grandfather 24    Social History   Socioeconomic History  . Marital status: Married    Spouse name: Not on file  . Number of children: Not on file  . Years of education: Not on file  . Highest education level: Not on file  Occupational History  . Not on file  Social Needs  . Financial resource strain: Not on file  . Food insecurity:    Worry: Not on file    Inability: Not on file  . Transportation needs:    Medical: Not on file    Non-medical: Not on file  Tobacco Use  . Smoking status: Never Smoker  . Smokeless tobacco: Never Used  Substance and Sexual Activity  . Alcohol use: No  . Drug use: No  . Sexual activity: Not Currently    Birth control/protection: I.U.D.    Comment: pt has IUD surgically removed  Lifestyle  . Physical activity:    Days per week: 3 days  Minutes per session: 30 min  . Stress: Only a little  Relationships  . Social connections:    Talks on phone: More than three times a week    Gets together: Twice a week    Attends religious service: Never    Active member of club or organization: No    Attends meetings of clubs or organizations: Never    Relationship status: Married  . Intimate partner violence:    Fear of current or ex partner: No    Emotionally abused: No    Physically abused: No    Forced sexual activity: No  Other Topics Concern  . Not on file  Social History Narrative  . Not on file      Review of Systems  Constitutional: Negative for chills, fatigue and unexpected weight  change.  HENT: Negative for congestion, rhinorrhea, sneezing and sore throat.   Eyes: Negative for photophobia, pain and redness.  Respiratory: Negative for cough, chest tightness and shortness of breath.   Cardiovascular: Negative for chest pain and palpitations.  Gastrointestinal: Negative for abdominal pain, constipation, diarrhea, nausea and vomiting.  Endocrine: Negative.   Genitourinary: Negative for dysuria and frequency.  Musculoskeletal: Negative for arthralgias, back pain, joint swelling and neck pain.  Skin: Negative for rash.  Allergic/Immunologic: Negative.   Neurological: Negative for tremors and numbness.  Hematological: Negative for adenopathy. Does not bruise/bleed easily.  Psychiatric/Behavioral: Negative for behavioral problems and sleep disturbance. The patient is not nervous/anxious.     Vital Signs: BP 119/88 (BP Location: Right Arm, Patient Position: Sitting, Cuff Size: Normal)   Pulse 66   Resp 16   Ht 5\' 5"  (1.651 m)   Wt 142 lb (64.4 kg)   SpO2 95%   BMI 23.63 kg/m    Physical Exam Vitals signs and nursing note reviewed.  Constitutional:      General: She is not in acute distress.    Appearance: She is well-developed. She is not diaphoretic.  HENT:     Head: Normocephalic and atraumatic.     Mouth/Throat:     Pharynx: No oropharyngeal exudate.  Eyes:     Pupils: Pupils are equal, round, and reactive to light.  Neck:     Musculoskeletal: Normal range of motion and neck supple.     Thyroid: No thyromegaly.     Vascular: No JVD.     Trachea: No tracheal deviation.  Cardiovascular:     Rate and Rhythm: Normal rate and regular rhythm.     Heart sounds: Normal heart sounds. No murmur. No friction rub. No gallop.   Pulmonary:     Effort: Pulmonary effort is normal. No respiratory distress.     Breath sounds: Normal breath sounds. No wheezing or rales.  Chest:     Chest wall: No tenderness.  Abdominal:     Palpations: Abdomen is soft.      Tenderness: There is no abdominal tenderness. There is no guarding.  Musculoskeletal: Normal range of motion.  Lymphadenopathy:     Cervical: No cervical adenopathy.  Skin:    General: Skin is warm and dry.  Neurological:     Mental Status: She is alert and oriented to person, place, and time.     Cranial Nerves: No cranial nerve deficit.  Psychiatric:        Behavior: Behavior normal.        Thought Content: Thought content normal.        Judgment: Judgment normal.    Assessment/Plan: 1. Attention  and concentration deficit Reviewed risks and possible side effects associated with taking opiates, benzodiazepines and other CNS depressants. Combination of these could cause dizziness and drowsiness. Advised patient not to drive or operate machinery when taking these medications, as patient's and other's life can be at risk and will have consequences. Patient verbalized understanding in this matter. Dependence and abuse for these drugs will be monitored closely. A Controlled substance policy and procedure is on file which allows [facility] to order a urine drug screen test at any visit. Patient understands and agrees with the plan - amphetamine-dextroamphetamine (ADDERALL) 10 MG tablet; Take 1 tablet (10 mg total) by mouth daily as needed (focus).  Dispense: 30 tablet; Refill: 0 - amphetamine-dextroamphetamine (ADDERALL) 10 MG tablet; Take 1 tablet (10 mg total) by mouth daily with breakfast.  Dispense: 30 tablet; Refill: 0  2. Oral soft tissue cyst Resolved.  Pt reports her dentist told her to try and pop this cyst.  It was likely a salivary duct that was blocked.  Pt reports she was able to pop it, and small stone came out, and it is completely healed at this time.   General Counseling: ninfa search understanding of the findings of todays visit and agrees with plan of treatment. I have discussed any further diagnostic evaluation that may be needed or ordered today. We also  reviewed her medications today. she has been encouraged to call the office with any questions or concerns that should arise related to todays visit.    No orders of the defined types were placed in this encounter.   Meds ordered this encounter  Medications  . amphetamine-dextroamphetamine (ADDERALL) 10 MG tablet    Sig: Take 1 tablet (10 mg total) by mouth daily as needed (focus).    Dispense:  30 tablet    Refill:  0  . amphetamine-dextroamphetamine (ADDERALL) 10 MG tablet    Sig: Take 1 tablet (10 mg total) by mouth daily with breakfast.    Dispense:  30 tablet    Refill:  0    Do not fill before 05/25/2018    Time spent: 25 Minutes   This patient was seen by Blima Ledger AGNP-C in Collaboration with Dr Lyndon Code as a part of collaborative care agreement     Johnna Acosta AGNP-C Internal medicine

## 2018-04-25 NOTE — Addendum Note (Signed)
Addended by: Annye Rusk on: 04/25/2018 04:13 PM   Modules accepted: Orders

## 2018-04-25 NOTE — Patient Instructions (Signed)
Amphetamine; Dextroamphetamine tablets  What is this medicine?  AMPHETAMINE; DEXTROAMPHETAMINE(am FET a meen; dex troe am FET a meen) is used to treat attention-deficit hyperactivity disorder (ADHD). It may also be used for narcolepsy. Federal law prohibits giving this medicine to any person other than the person for whom it was prescribed. Do not share this medicine with anyone else.  This medicine may be used for other purposes; ask your health care provider or pharmacist if you have questions.  COMMON BRAND NAME(S): Adderall  What should I tell my health care provider before I take this medicine?  They need to know if you have any of these conditions:  -anxiety or panic attacks  -circulation problems in fingers and toes  -glaucoma  -hardening or blockages of the arteries or heart blood vessels  -heart disease or a heart defect  -high blood pressure  -history of a drug or alcohol abuse problem  -history of stroke  -kidney disease  -liver disease  -mental illness  -seizures  -suicidal thoughts, plans, or attempt; a previous suicide attempt by you or a family member  -thyroid disease  -Tourette's syndrome  -an unusual or allergic reaction to dextroamphetamine, other amphetamines, other medicines, foods, dyes, or preservatives  -pregnant or trying to get pregnant  -breast-feeding  How should I use this medicine?  Take this medicine by mouth with a glass of water. Follow the directions on the prescription label. Take your doses at regular intervals. Do not take your medicine more often than directed. Do not suddenly stop your medicine. You must gradually reduce the dose or you may feel withdrawal effects. Ask your doctor or health care professional for advice.  Talk to your pediatrician regarding the use of this medicine in children. Special care may be needed. While this drug may be prescribed for children as young as 3 years for selected conditions, precautions do apply.  Overdosage: If you think you have taken too  much of this medicine contact a poison control center or emergency room at once.  NOTE: This medicine is only for you. Do not share this medicine with others.  What if I miss a dose?  If you miss a dose, take it as soon as you can. If it is almost time for your next dose, take only that dose. Do not take double or extra doses.  What may interact with this medicine?  Do not take this medicine with any of the following medications:  -MAOIs like Carbex, Eldepryl, Marplan, Nardil, and Parnate  -other stimulant medicines for attention disorders  This medicine may also interact with the following medications:  -acetazolamide  -ammonium chloride  -antacids  -ascorbic acid  -atomoxetine  -caffeine  -certain medicines for blood pressure  -certain medicines for depression, anxiety, or psychotic disturbances  -certain medicines for seizures like carbamazepine, phenobarbital, phenytoin  -certain medicines for stomach problems like cimetidine, ranitidine, famotidine, esomeprazole, omeprazole, lansoprazole, pantoprazole  -lithium  -medicines for colds and breathing difficulties  -medicines for diabetes  -medicines or dietary supplements for weight loss or to stay awake  -methenamine  -narcotic medicines for pain  -quinidine  -ritonavir  -sodium bicarbonate  -St. John's wort  This list may not describe all possible interactions. Give your health care provider a list of all the medicines, herbs, non-prescription drugs, or dietary supplements you use. Also tell them if you smoke, drink alcohol, or use illegal drugs. Some items may interact with your medicine.  What should I watch for while using this medicine?    Visit your doctor or health care professional for regular checks on your progress. This prescription requires that you follow special procedures with your doctor and pharmacy. You will need to have a new written prescription from your doctor every time you need a refill.  This medicine may affect your concentration, or hide  signs of tiredness. Until you know how this medicine affects you, do not drive, ride a bicycle, use machinery, or do anything that needs mental alertness.  Tell your doctor or health care professional if this medicine loses its effects, or if you feel you need to take more than the prescribed amount. Do not change the dosage without talking to your doctor or health care professional.  Decreased appetite is a common side effect when starting this medicine. Eating small, frequent meals or snacks can help. Talk to your doctor if you continue to have poor eating habits. Height and weight growth of a child taking this medicine will be monitored closely.  Do not take this medicine close to bedtime. It may prevent you from sleeping.  If you are going to need surgery, a MRI, CT scan, or other procedure, tell your doctor that you are taking this medicine. You may need to stop taking this medicine before the procedure.  Tell your doctor or healthcare professional right away if you notice unexplained wounds on your fingers and toes while taking this medicine. You should also tell your healthcare provider if you experience numbness or pain, changes in the skin color, or sensitivity to temperature in your fingers or toes.  What side effects may I notice from receiving this medicine?  Side effects that you should report to your doctor or health care professional as soon as possible:  -allergic reactions like skin rash, itching or hives, swelling of the face, lips, or tongue  -anxious  -breathing problems  -changes in emotions or moods  -changes in vision  -chest pain or chest tightness  -fast, irregular heartbeat  -fingers or toes feel numb, cool, painful  -hallucination, loss of contact with reality  -high blood pressure  -males: prolonged or painful erection  -seizures  -signs and symptoms of serotonin syndrome like confusion, increased sweating, fever, tremor, stiff muscles, diarrhea  -signs and symptoms of a stroke like  changes in vision; confusion; trouble speaking or understanding; severe headaches; sudden numbness or weakness of the face, arm or leg; trouble walking; dizziness; loss of balance or coordination  -suicidal thoughts or other mood changes  -uncontrollable head, mouth, neck, arm, or leg movements  Side effects that usually do not require medical attention (report to your doctor or health care professional if they continue or are bothersome):  -dry mouth  -headache  -irritability  -loss of appetite  -nausea  -trouble sleeping  -weight loss  This list may not describe all possible side effects. Call your doctor for medical advice about side effects. You may report side effects to FDA at 1-800-FDA-1088.  Where should I keep my medicine?  Keep out of the reach of children. This medicine can be abused. Keep your medicine in a safe place to protect it from theft. Do not share this medicine with anyone. Selling or giving away this medicine is dangerous and against the law.  Store at room temperature between 15 and 30 degrees C (59 and 86 degrees F). Keep container tightly closed. Throw away any unused medicine after the expiration date. Dispose of properly. This medicine may cause accidental overdose and death if it is taken by   other adults, children, or pets. Mix any unused medicine with a substance like cat litter or coffee grounds. Then throw the medicine away in a sealed container like a sealed bag or a coffee can with a lid. Do not use the medicine after the expiration date.  NOTE: This sheet is a summary. It may not cover all possible information. If you have questions about this medicine, talk to your doctor, pharmacist, or health care provider.   2019 Elsevier/Gold Standard (2016-03-10 16:28:23)

## 2018-05-03 ENCOUNTER — Telehealth: Payer: Self-pay | Admitting: Adult Health

## 2018-05-03 MED ORDER — FLUCONAZOLE 150 MG PO TABS: ORAL_TABLET | ORAL | 0 refills | Status: DC

## 2018-05-03 NOTE — Telephone Encounter (Signed)
Patient has a yeast infection, been using monistat and had a reaction  From use is wondering If we can call in a rx for diflucan

## 2018-05-07 ENCOUNTER — Other Ambulatory Visit: Payer: Self-pay | Admitting: Internal Medicine

## 2018-07-22 ENCOUNTER — Ambulatory Visit: Payer: Medicaid Other | Admitting: Adult Health

## 2018-07-22 ENCOUNTER — Other Ambulatory Visit: Payer: Self-pay

## 2018-07-22 ENCOUNTER — Encounter: Payer: Self-pay | Admitting: Adult Health

## 2018-07-22 VITALS — BP 102/62 | HR 85 | Resp 16 | Ht 65.0 in | Wt 131.0 lb

## 2018-07-22 DIAGNOSIS — Z79899 Other long term (current) drug therapy: Secondary | ICD-10-CM | POA: Diagnosis not present

## 2018-07-22 DIAGNOSIS — R41840 Attention and concentration deficit: Secondary | ICD-10-CM

## 2018-07-22 DIAGNOSIS — G43901 Migraine, unspecified, not intractable, with status migrainosus: Secondary | ICD-10-CM

## 2018-07-22 LAB — POCT URINE DRUG SCREEN
POC Amphetamine UR: POSITIVE — AB
POC BENZODIAZEPINES UR: NOT DETECTED
POC Barbiturate UR: NOT DETECTED
POC Cocaine UR: NOT DETECTED
POC Ecstasy UR: NOT DETECTED
POC Marijuana UR: NOT DETECTED
POC Methadone UR: NOT DETECTED
POC Methamphetamine UR: NOT DETECTED
POC Opiate Ur: NOT DETECTED
POC Oxycodone UR: NOT DETECTED
POC PHENCYCLIDINE UR: NOT DETECTED
POC TRICYCLICS UR: NOT DETECTED

## 2018-07-22 MED ORDER — UBRELVY 50 MG PO TABS: 1.0000 | ORAL_TABLET | ORAL | 0 refills | Status: DC | PRN

## 2018-07-22 MED ORDER — AMPHETAMINE-DEXTROAMPHETAMINE 10 MG PO TABS: 10.0000 mg | ORAL_TABLET | Freq: Every day | ORAL | 0 refills | Status: DC

## 2018-07-22 NOTE — Progress Notes (Signed)
[facility] [address] [place], [place] [id number]  Internal MEDICINE  Office Visit Note  Patient Name: Carolyn Edwards  [id number] 03, 1987  [id number]  Date of Service: 07/22/2018  Chief Complaint  Patient presents with  . Medical Management of Chronic Issues    3 month follow up , medication refills   . Migraine    would like to discuss medications     HPI  Pt is here for 3 month follow up and prescription refills. Pt is currently being treated for ADHD and takes adderall 10mg  daily at this time. She reports this dose controls her symptoms at this time.  She has been having migraines also, and would be interested in getting medication to help alleviate these.      Current Medication: Outpatient Encounter Medications as of 07/22/2018  Medication Sig  . amphetamine-dextroamphetamine (ADDERALL) 10 MG tablet Take 1 tablet (10 mg total) by mouth daily with breakfast.  . Aspirin-Acetaminophen-Caffeine (GOODY HEADACHE PO) Take 1 packet by mouth daily as needed (headaches).  Marland Kitchen. ibuprofen (ADVIL,MOTRIN) 600 MG tablet Take 1 tablet (600 mg total) by mouth every 6 (six) hours as needed.  . Multiple Vitamins-Minerals (HAIR SKIN AND NAILS FORMULA) TABS Take 3 tablets by mouth daily.  . valACYclovir (VALTREX) 1000 MG tablet Take 1 tablet (1,000 mg total) by mouth 2 (two) times daily.  . [DISCONTINUED] amphetamine-dextroamphetamine (ADDERALL) 10 MG tablet Take 1 tablet (10 mg total) by mouth daily as needed (focus). (Patient not taking: Reported on 07/22/2018)  . [DISCONTINUED] fluconazole (DIFLUCAN) 150 MG tablet TAKE ONE TABLET AND IF SYMPTOMS PERSIST AFTER THREE DAYS TAKE ONE MORE TABLET (Patient not taking: Reported on 07/22/2018)  . [DISCONTINUED] norethindrone (AYGESTIN) 5 MG tablet Take 1 tablet (5 mg total) by mouth daily. (Patient not taking: Reported on 07/22/2018)   No facility-administered encounter medications on file as of 07/22/2018.     Surgical History: Past Surgical  History:  Procedure Laterality Date  . BREAST ENHANCEMENT SURGERY    . COLPOSCOPY  01/2005   bxs neg  . FOREIGN BODY REMOVAL N/A 03/22/2017   Procedure: FOREIGN BODY REMOVAL;  Surgeon: Vena AustriaStaebler, Andreas, MD;  Location: [facility] ORS;  Service: Gynecology;  Laterality: N/A;  . INTRAUTERINE DEVICE (IUD) INSERTION  08/2006   Mirena    Medical History: Past Medical History:  Diagnosis Date  . Dysmetabolic syndrome X 03/23/2011  . Genital warts   . LGSIL on Pap smear of cervix 2006  . Migraine   . Ovarian cyst 11/23/2015  . Thyroid nodule     Family History: Family History  Problem Relation Age of Onset  . Diabetes Mother   . Colon cancer Maternal Grandfather [id number]    Social History   Socioeconomic History  . Marital status: Married    Spouse name: Not on file  . Number of children: Not on file  . Years of education: Not on file  . Highest education level: Not on file  Occupational History  . Not on file  Social Needs  . Financial resource strain: Not on file  . Food insecurity    Worry: Not on file    Inability: Not on file  . Transportation needs    Medical: Not on file    Non-medical: Not on file  Tobacco Use  . Smoking status: Never Smoker  . Smokeless tobacco: Never Used  Substance and Sexual Activity  . Alcohol use: No  . Drug use: No  . Sexual activity: Not Currently  Birth control/protection: I.U.D.    Comment: pt has IUD surgically removed  Lifestyle  . Physical activity    Days per week: 3 days    Minutes per session: 30 min  . Stress: Only a little  Relationships  . Social connections    Talks on phone: More than three times a week    Gets together: Twice a week    Attends religious service: Never    Active member of club or organization: No    Attends meetings of clubs or organizations: Never    Relationship status: Married  . Intimate partner violence    Fear of current or ex partner: No    Emotionally abused: No    Physically abused: No     Forced sexual activity: No  Other Topics Concern  . Not on file  Social History Narrative  . Not on file      Review of Systems  Constitutional: Negative for chills, fatigue and unexpected weight change.  HENT: Negative for congestion, rhinorrhea, sneezing and sore throat.   Eyes: Negative for photophobia, pain and redness.  Respiratory: Negative for cough, chest tightness and shortness of breath.   Cardiovascular: Negative for chest pain and palpitations.  Gastrointestinal: Negative for abdominal pain, constipation, diarrhea, nausea and vomiting.  Endocrine: Negative.   Genitourinary: Negative for dysuria and frequency.  Musculoskeletal: Negative for arthralgias, back pain, joint swelling and neck pain.  Skin: Negative for rash.  Allergic/Immunologic: Negative.   Neurological: Negative for tremors and numbness.  Hematological: Negative for adenopathy. Does not bruise/bleed easily.  Psychiatric/Behavioral: Negative for behavioral problems and sleep disturbance. The patient is not nervous/anxious.     Vital Signs: BP 102/62   Pulse 85   Resp 16   Ht 5\' 5"  (1.651 m)   Wt 131 lb (59.4 kg)   SpO2 98%   BMI 21.80 kg/m    Physical Exam Vitals signs and nursing note reviewed.  Constitutional:      General: She is not in acute distress.    Appearance: She is well-developed. She is not diaphoretic.  HENT:     Head: Normocephalic and atraumatic.     Mouth/Throat:     Pharynx: No oropharyngeal exudate.  Eyes:     Pupils: Pupils are equal, round, and reactive to light.  Neck:     Musculoskeletal: Normal range of motion and neck supple.     Thyroid: No thyromegaly.     Vascular: No JVD.     Trachea: No tracheal deviation.  Cardiovascular:     Rate and Rhythm: Normal rate and regular rhythm.     Heart sounds: Normal heart sounds. No murmur. No friction rub. No gallop.   Pulmonary:     Effort: Pulmonary effort is normal. No respiratory distress.     Breath sounds: Normal  breath sounds. No wheezing or rales.  Chest:     Chest wall: No tenderness.  Abdominal:     Palpations: Abdomen is soft.     Tenderness: There is no abdominal tenderness. There is no guarding.  Musculoskeletal: Normal range of motion.  Lymphadenopathy:     Cervical: No cervical adenopathy.  Skin:    General: Skin is warm and dry.  Neurological:     Mental Status: She is alert and oriented to person, place, and time.     Cranial Nerves: No cranial nerve deficit.  Psychiatric:        Behavior: Behavior normal.        Thought Content:  Thought content normal.        Judgment: Judgment normal.    Assessment/Plan: 1. Attention and concentration deficit Refilled Controlled medications today. Reviewed risks and possible side effects associated with taking Stimulants. Combination of these drugs with other psychotropic medications could cause dizziness and drowsiness. Pt needs to Monitor symptoms and exercise caution in driving and operating heavy machinery to avoid damages to oneself, to others and to the surroundings. Patient verbalized understanding in this matter. Dependence and abuse for these drugs will be monitored closely. A Controlled substance policy and procedure is on file which allows [facility] to order a urine drug screen test at any visit. Patient understands and agrees with the plan.. - amphetamine-dextroamphetamine (ADDERALL) 10 MG tablet; Take 1 tablet (10 mg total) by mouth daily with breakfast.  Dispense: 30 tablet; Refill: 0 - amphetamine-dextroamphetamine (ADDERALL) 10 MG tablet; Take 1 tablet (10 mg total) by mouth daily.  Dispense: 30 tablet; Refill: 0  2. Migraine with status migrainosus, not intractable, unspecified migraine type Use Ubrelvy as discussed for abortive therapy.   - Ubrogepant (UBRELVY) 50 MG TABS; Take 1-2 tablets by mouth as needed.  Dispense: 10 tablet; Refill: 0  3. High risk medication use - POCT Urine Drug Screen  General Counseling:  Morrie Sheldonshley verbalizes understanding of the findings of todays visit and agrees with plan of treatment. I have discussed any further diagnostic evaluation that may be needed or ordered today. We also reviewed her medications today. she has been encouraged to call the office with any questions or concerns that should arise related to todays visit.    Orders Placed This Encounter  Procedures  . POCT Urine Drug Screen    No orders of the defined types were placed in this encounter.   Time spent: 25 Minutes   This patient was seen by Blima LedgerAdam Mintie Witherington AGNP-C in Collaboration with Dr Lyndon CodeFozia M Khan as a part of collaborative care agreement     Johnna AcostaAdam J. Keondria Siever AGNP-C Internal medicine

## 2018-09-11 ENCOUNTER — Encounter: Payer: Self-pay | Admitting: Adult Health

## 2018-09-11 ENCOUNTER — Other Ambulatory Visit: Payer: Self-pay

## 2018-09-11 ENCOUNTER — Ambulatory Visit: Payer: Medicaid Other | Admitting: Adult Health

## 2018-09-11 VITALS — BP 109/78 | HR 62 | Resp 16 | Ht 64.0 in | Wt 132.8 lb

## 2018-09-11 DIAGNOSIS — B373 Candidiasis of vulva and vagina: Secondary | ICD-10-CM

## 2018-09-11 DIAGNOSIS — G43901 Migraine, unspecified, not intractable, with status migrainosus: Secondary | ICD-10-CM | POA: Diagnosis not present

## 2018-09-11 DIAGNOSIS — J039 Acute tonsillitis, unspecified: Secondary | ICD-10-CM

## 2018-09-11 DIAGNOSIS — B3731 Acute candidiasis of vulva and vagina: Secondary | ICD-10-CM

## 2018-09-11 MED ORDER — FLUCONAZOLE 150 MG PO TABS: 150.0000 mg | ORAL_TABLET | ORAL | 0 refills | Status: DC | PRN

## 2018-09-11 MED ORDER — BUTALBITAL-APAP-CAFFEINE 50-325-40 MG PO TABS: 1.0000 | ORAL_TABLET | Freq: Four times a day (QID) | ORAL | 0 refills | Status: DC | PRN

## 2018-09-11 MED ORDER — AMOXICILLIN-POT CLAVULANATE 875-125 MG PO TABS: 1.0000 | ORAL_TABLET | Freq: Two times a day (BID) | ORAL | 0 refills | Status: DC

## 2018-09-11 NOTE — Progress Notes (Signed)
[facility] [place], [place] [address]  Internal MEDICINE  Office Visit Note  Patient Name: Carolyn Edwards  [id number]  [id number]  Date of Service: 09/11/2018  Chief Complaint  Patient presents with  . Follow-up  . Migraine    4 days out of the month, 4th day its goes away  . Sore Throat    tonsils are swollen looks like there are stones, swollen lymph nodes  . ADD    HPI Pt seen today complaints of bilateral tonsil stones, no complaints of sore throat, also left sided swollen lymph node neck. Also complains of productive cough and mild nasal congestion for two months that has improved over time, denies fever, chest pain or shortness of breath. Tried NyQuil with some relief and OTC cough suppressant. Continues to complain of migraines since last visit, was unable to have prescription filled due to cost. Continues to take Goody powder for migraines. Reports having 4 migraine days out of month around menstrual cycle.  Reports migraine today and menstrual     Current Medication: Outpatient Encounter Medications as of 09/11/2018  Medication Sig  . amphetamine-dextroamphetamine (ADDERALL) 10 MG tablet Take 1 tablet (10 mg total) by mouth daily with breakfast.  . amphetamine-dextroamphetamine (ADDERALL) 10 MG tablet Take 1 tablet (10 mg total) by mouth daily.  . Aspirin-Acetaminophen-Caffeine (GOODY HEADACHE PO) Take 1 packet by mouth daily as needed (headaches).  Marland Kitchen ibuprofen (ADVIL,MOTRIN) 600 MG tablet Take 1 tablet (600 mg total) by mouth every 6 (six) hours as needed.  . Multiple Vitamins-Minerals (HAIR SKIN AND NAILS FORMULA) TABS Take 3 tablets by mouth daily.  Marland Kitchen Ubrogepant (UBRELVY) 50 MG TABS Take 1-2 tablets by mouth as needed.  . valACYclovir (VALTREX) 1000 MG tablet Take 1 tablet (1,000 mg total) by mouth 2 (two) times daily.   No facility-administered encounter medications on file as of 09/11/2018.     Surgical History: Past Surgical  History:  Procedure Laterality Date  . BREAST ENHANCEMENT SURGERY    . COLPOSCOPY  01/2005   bxs neg  . FOREIGN BODY REMOVAL N/A 03/22/2017   Procedure: FOREIGN BODY REMOVAL;  Surgeon: Malachy Mood, MD;  Location: [facility] ORS;  Service: Gynecology;  Laterality: N/A;  . INTRAUTERINE DEVICE (IUD) INSERTION  08/2006   Mirena    Medical History: Past Medical History:  Diagnosis Date  . Dysmetabolic syndrome X 00/93/8182  . Genital warts   . LGSIL on Pap smear of cervix 2006  . Migraine   . Ovarian cyst 11/23/2015  . Thyroid nodule     Family History: Family History  Problem Relation Age of Onset  . Diabetes Mother   . Colon cancer Maternal Grandfather 64    Social History   Socioeconomic History  . Marital status: Married    Spouse name: Not on file  . Number of children: Not on file  . Years of education: Not on file  . Highest education level: Not on file  Occupational History  . Not on file  Social Needs  . Financial resource strain: Not on file  . Food insecurity    Worry: Not on file    Inability: Not on file  . Transportation needs    Medical: Not on file    Non-medical: Not on file  Tobacco Use  . Smoking status: Never Smoker  . Smokeless tobacco: Never Used  Substance and Sexual Activity  . Alcohol use: No  . Drug use: No  . Sexual activity: Not Currently  Birth control/protection: I.U.D.    Comment: pt has IUD surgically removed  Lifestyle  . Physical activity    Days per week: 3 days    Minutes per session: 30 min  . Stress: Only a little  Relationships  . Social connections    Talks on phone: More than three times a week    Gets together: Twice a week    Attends religious service: Never    Active member of club or organization: No    Attends meetings of clubs or organizations: Never    Relationship status: Married  . Intimate partner violence    Fear of current or ex partner: No    Emotionally abused: No    Physically abused: No     Forced sexual activity: No  Other Topics Concern  . Not on file  Social History Narrative  . Not on file      Review of Systems  Constitutional: Negative for chills, fatigue and unexpected weight change.  HENT: Negative for congestion, rhinorrhea, sneezing and sore throat.   Eyes: Negative for photophobia, pain and redness.  Respiratory: Negative for cough, chest tightness and shortness of breath.   Cardiovascular: Negative for chest pain and palpitations.  Gastrointestinal: Negative for abdominal pain, constipation, diarrhea, nausea and vomiting.  Endocrine: Negative.   Genitourinary: Negative for dysuria and frequency.  Musculoskeletal: Negative for arthralgias, back pain, joint swelling and neck pain.  Skin: Negative for rash.  Allergic/Immunologic: Negative.   Neurological: Negative for tremors and numbness.  Hematological: Negative for adenopathy. Does not bruise/bleed easily.  Psychiatric/Behavioral: Negative for behavioral problems and sleep disturbance. The patient is not nervous/anxious.     Vital Signs: BP 109/78   Pulse 62   Resp 16   Ht 5\' 4"  (1.626 m)   Wt 132 lb 12.8 oz (60.2 kg)   SpO2 99%   BMI 22.80 kg/m    Physical Exam Vitals signs and nursing note reviewed.  Constitutional:      General: She is not in acute distress.    Appearance: She is well-developed. She is not diaphoretic.  HENT:     Head: Normocephalic and atraumatic.     Mouth/Throat:     Pharynx: No oropharyngeal exudate.  Eyes:     Pupils: Pupils are equal, round, and reactive to light.  Neck:     Musculoskeletal: Normal range of motion and neck supple.     Thyroid: No thyromegaly.     Vascular: No JVD.     Trachea: No tracheal deviation.  Cardiovascular:     Rate and Rhythm: Normal rate and regular rhythm.     Heart sounds: Normal heart sounds. No murmur. No friction rub. No gallop.   Pulmonary:     Effort: Pulmonary effort is normal. No respiratory distress.     Breath sounds:  Normal breath sounds. No wheezing or rales.  Chest:     Chest wall: No tenderness.  Abdominal:     Palpations: Abdomen is soft.     Tenderness: There is no abdominal tenderness. There is no guarding.  Musculoskeletal: Normal range of motion.  Lymphadenopathy:     Cervical: No cervical adenopathy.  Skin:    General: Skin is warm and dry.  Neurological:     Mental Status: She is alert and oriented to person, place, and time.     Cranial Nerves: No cranial nerve deficit.  Psychiatric:        Behavior: Behavior normal.  Thought Content: Thought content normal.        Judgment: Judgment normal.     Assessment/Plan: 1. Migraine with status migrainosus, not intractable, unspecified migraine type We will try Fioricet at this time.  Patient will keep diary of foods and activities that precede migraines. - butalbital-acetaminophen-caffeine (FIORICET) 50-325-40 MG tablet; Take 1-2 tablets by mouth every 6 (six) hours as needed for headache.  Dispense: 20 tablet; Refill: 0  2. Tonsillitis Advised patient to take entire course of antibiotics as prescribed with food. Pt should return to clinic in 7-10 days if symptoms fail to improve or new symptoms develop.  - amoxicillin-clavulanate (AUGMENTIN) 875-125 MG tablet; Take 1 tablet by mouth 2 (two) times daily.  Dispense: 20 tablet; Refill: 0  3. Vaginal candida Take Diflucan as directed. - fluconazole (DIFLUCAN) 150 MG tablet; Take 1 tablet (150 mg total) by mouth every three (3) days as needed.  Dispense: 3 tablet; Refill: 0  General Counseling: Morrie Sheldonshley verbalizes understanding of the findings of todays visit and agrees with plan of treatment. I have discussed any further diagnostic evaluation that may be needed or ordered today. We also reviewed her medications today. she has been encouraged to call the office with any questions or concerns that should arise related to todays visit.    No orders of the defined types were placed in this  encounter.   No orders of the defined types were placed in this encounter.   Time spent: 25 Minutes   This patient was seen by Blima LedgerAdam Yaneliz Radebaugh AGNP-C in Collaboration with Dr Lyndon CodeFozia M Khan as a part of collaborative care agreement     Johnna AcostaAdam J. Reise Hietala AGNP-C Internal medicine

## 2018-09-18 ENCOUNTER — Ambulatory Visit: Payer: Medicaid Other | Admitting: Adult Health

## 2018-10-17 ENCOUNTER — Ambulatory Visit: Payer: Medicaid Other | Admitting: Adult Health

## 2018-10-23 ENCOUNTER — Ambulatory Visit: Payer: Medicaid Other | Admitting: Adult Health

## 2018-10-29 ENCOUNTER — Ambulatory Visit: Payer: Medicaid Other | Admitting: Adult Health

## 2018-10-29 ENCOUNTER — Other Ambulatory Visit: Payer: Self-pay

## 2018-10-29 ENCOUNTER — Encounter: Payer: Self-pay | Admitting: Adult Health

## 2018-10-29 VITALS — BP 118/84 | HR 88 | Resp 16 | Ht 64.0 in | Wt 138.0 lb

## 2018-10-29 DIAGNOSIS — R41840 Attention and concentration deficit: Secondary | ICD-10-CM

## 2018-10-29 DIAGNOSIS — G43901 Migraine, unspecified, not intractable, with status migrainosus: Secondary | ICD-10-CM | POA: Diagnosis not present

## 2018-10-29 DIAGNOSIS — Z79899 Other long term (current) drug therapy: Secondary | ICD-10-CM | POA: Diagnosis not present

## 2018-10-29 LAB — POCT URINE DRUG SCREEN
POC Amphetamine UR: POSITIVE — AB
POC BENZODIAZEPINES UR: NOT DETECTED
POC Barbiturate UR: NOT DETECTED
POC Cocaine UR: NOT DETECTED
POC Ecstasy UR: NOT DETECTED
POC Marijuana UR: NOT DETECTED
POC Methadone UR: NOT DETECTED
POC Methamphetamine UR: NOT DETECTED
POC Opiate Ur: NOT DETECTED
POC Oxycodone UR: NOT DETECTED
POC PHENCYCLIDINE UR: NOT DETECTED
POC TRICYCLICS UR: NOT DETECTED

## 2018-10-29 MED ORDER — AMPHETAMINE-DEXTROAMPHETAMINE 10 MG PO TABS: 10.0000 mg | ORAL_TABLET | Freq: Every day | ORAL | 0 refills | Status: DC

## 2018-10-29 NOTE — Progress Notes (Addendum)
[facility] [place], [place] [address]  Internal MEDICINE  Office Visit Note  Patient Name: Carolyn Edwards  [id number]  [id number]  Date of Service: 10/29/2018  Chief Complaint  Patient presents with  . Medical Management of Chronic Issues    mediation refills , birth control   . ADD    HPI  Pt is here for follow up on Migraines and ADD.  She reports having a few headaches in the past two months, but she would not describe them as migraines.  She feels like her migraines are well controlled at this time.  She reports her ADD is well controlled on her current dose of Adderall.  She denies any side effects or issues at this time.      Current Medication: Outpatient Encounter Medications as of 10/29/2018  Medication Sig  . amphetamine-dextroamphetamine (ADDERALL) 10 MG tablet Take 1 tablet (10 mg total) by mouth daily.  . Aspirin-Acetaminophen-Caffeine (GOODY HEADACHE PO) Take 1 packet by mouth daily as needed (headaches).  . butalbital-acetaminophen-caffeine (FIORICET) 50-325-40 MG tablet Take 1-2 tablets by mouth every 6 (six) hours as needed for headache.  . ibuprofen (ADVIL,MOTRIN) 600 MG tablet Take 1 tablet (600 mg total) by mouth every 6 (six) hours as needed.  . Multiple Vitamins-Minerals (HAIR SKIN AND NAILS FORMULA) TABS Take 3 tablets by mouth daily.  Marland Kitchen Ubrogepant (UBRELVY) 50 MG TABS Take 1-2 tablets by mouth as needed.  . valACYclovir (VALTREX) 1000 MG tablet Take 1 tablet (1,000 mg total) by mouth 2 (two) times daily.  . [DISCONTINUED] norethindrone (AYGESTIN) 5 MG tablet Take 5 mg by mouth daily.  . [DISCONTINUED] amoxicillin-clavulanate (AUGMENTIN) 875-125 MG tablet Take 1 tablet by mouth 2 (two) times daily. (Patient not taking: Reported on 10/29/2018)  . [DISCONTINUED] amphetamine-dextroamphetamine (ADDERALL) 10 MG tablet Take 1 tablet (10 mg total) by mouth daily with breakfast. (Patient not taking: Reported on 10/29/2018)  .  [DISCONTINUED] fluconazole (DIFLUCAN) 150 MG tablet Take 1 tablet (150 mg total) by mouth every three (3) days as needed. (Patient not taking: Reported on 10/29/2018)   No facility-administered encounter medications on file as of 10/29/2018.     Surgical History: Past Surgical History:  Procedure Laterality Date  . BREAST ENHANCEMENT SURGERY    . COLPOSCOPY  01/2005   bxs neg  . FOREIGN BODY REMOVAL N/A 03/22/2017   Procedure: FOREIGN BODY REMOVAL;  Surgeon: Malachy Mood, MD;  Location: [facility] ORS;  Service: Gynecology;  Laterality: N/A;  . INTRAUTERINE DEVICE (IUD) INSERTION  08/2006   Mirena    Medical History: Past Medical History:  Diagnosis Date  . Dysmetabolic syndrome X 95/28/4132  . Genital warts   . LGSIL on Pap smear of cervix 2006  . Migraine   . Ovarian cyst 11/23/2015  . Thyroid nodule     Family History: Family History  Problem Relation Age of Onset  . Diabetes Mother   . Colon cancer Maternal Grandfather 54    Social History   Socioeconomic History  . Marital status: Married    Spouse name: Not on file  . Number of children: Not on file  . Years of education: Not on file  . Highest education level: Not on file  Occupational History  . Not on file  Social Needs  . Financial resource strain: Not on file  . Food insecurity    Worry: Not on file    Inability: Not on file  . Transportation needs    Medical: Not on  file    Non-medical: Not on file  Tobacco Use  . Smoking status: Never Smoker  . Smokeless tobacco: Never Used  Substance and Sexual Activity  . Alcohol use: No  . Drug use: No  . Sexual activity: Not Currently    Birth control/protection: I.U.D.    Comment: pt has IUD surgically removed  Lifestyle  . Physical activity    Days per week: 3 days    Minutes per session: 30 min  . Stress: Only a little  Relationships  . Social connections    Talks on phone: More than three times a week    Gets together: Twice a week    Attends  religious service: Never    Active member of club or organization: No    Attends meetings of clubs or organizations: Never    Relationship status: Married  . Intimate partner violence    Fear of current or ex partner: No    Emotionally abused: No    Physically abused: No    Forced sexual activity: No  Other Topics Concern  . Not on file  Social History Narrative  . Not on file      Review of Systems  Constitutional: Negative for chills, fatigue and unexpected weight change.  HENT: Negative for congestion, rhinorrhea, sneezing and sore throat.   Eyes: Negative for photophobia, pain and redness.  Respiratory: Negative for cough, chest tightness and shortness of breath.   Cardiovascular: Negative for chest pain and palpitations.  Gastrointestinal: Negative for abdominal pain, constipation, diarrhea, nausea and vomiting.  Endocrine: Negative.   Genitourinary: Negative for dysuria and frequency.  Musculoskeletal: Negative for arthralgias, back pain, joint swelling and neck pain.  Skin: Negative for rash.  Allergic/Immunologic: Negative.   Neurological: Negative for tremors and numbness.  Hematological: Negative for adenopathy. Does not bruise/bleed easily.  Psychiatric/Behavioral: Negative for behavioral problems and sleep disturbance. The patient is not nervous/anxious.     Vital Signs: BP 118/84   Pulse 88   Resp 16   Ht 5\' 4"  (1.626 m)   Wt 138 lb (62.6 kg)   SpO2 98%   BMI 23.69 kg/m    Physical Exam Vitals signs and nursing note reviewed.  Constitutional:      General: She is not in acute distress.    Appearance: She is well-developed. She is not diaphoretic.  HENT:     Head: Normocephalic and atraumatic.     Mouth/Throat:     Pharynx: No oropharyngeal exudate.  Eyes:     Pupils: Pupils are equal, round, and reactive to light.  Neck:     Musculoskeletal: Normal range of motion and neck supple.     Thyroid: No thyromegaly.     Vascular: No JVD.     Trachea:  No tracheal deviation.  Cardiovascular:     Rate and Rhythm: Normal rate and regular rhythm.     Heart sounds: Normal heart sounds. No murmur. No friction rub. No gallop.   Pulmonary:     Effort: Pulmonary effort is normal. No respiratory distress.     Breath sounds: Normal breath sounds. No wheezing or rales.  Chest:     Chest wall: No tenderness.  Abdominal:     Palpations: Abdomen is soft.     Tenderness: There is no abdominal tenderness. There is no guarding.  Musculoskeletal: Normal range of motion.  Lymphadenopathy:     Cervical: No cervical adenopathy.  Skin:    General: Skin is warm and dry.  Neurological:  Mental Status: She is alert and oriented to person, place, and time.     Cranial Nerves: No cranial nerve deficit.  Psychiatric:        Behavior: Behavior normal.        Thought Content: Thought content normal.        Judgment: Judgment normal.    Assessment/Plan: 1. Attention and concentration deficit Refilled Controlled medications today. Reviewed risks and possible side effects associated with taking Stimulants. Combination of these drugs with other psychotropic medications could cause dizziness and drowsiness. Pt needs to Monitor symptoms and exercise caution in driving and operating heavy machinery to avoid damages to oneself, to others and to the surroundings. Patient verbalized understanding in this matter. Dependence and abuse for these drugs will be monitored closely. A Controlled substance policy and procedure is on file which allows [facility] to order a urine drug screen test at any visit. Patient understands and agrees with the plan.. - amphetamine-dextroamphetamine (ADDERALL) 10 MG tablet; Take 1 tablet (10 mg total) by mouth daily.  Dispense: 30 tablet; Refill: 0 - amphetamine-dextroamphetamine (ADDERALL) 10 MG tablet; Take 1 tablet (10 mg total) by mouth daily.  Dispense: 30 tablet; Refill: 0  2. Migraine with status migrainosus, not  intractable, unspecified migraine type Appears to be controlled at this time.  Continue present management.   3. Encounter for long-term (current) use of high-risk medication - POCT Urine Drug Screen  General Counseling: Morrie Sheldonshley verbalizes understanding of the findings of todays visit and agrees with plan of treatment. I have discussed any further diagnostic evaluation that may be needed or ordered today. We also reviewed her medications today. she has been encouraged to call the office with any questions or concerns that should arise related to todays visit.    Orders Placed This Encounter  Procedures  . POCT Urine Drug Screen    No orders of the defined types were placed in this encounter.   Time spent: 25 Minutes   This patient was seen by Blima LedgerAdam Sharetha Newson AGNP-C in Collaboration with Dr Lyndon CodeFozia M Khan as a part of collaborative care agreement     Johnna AcostaAdam J. Fredna Stricker AGNP-C Internal medicine

## 2018-12-10 NOTE — Addendum Note (Signed)
Addended by: Kendell Bane on: 12/10/2018 08:16 AM   Modules accepted: Level of Service

## 2018-12-23 ENCOUNTER — Telehealth: Payer: Self-pay

## 2018-12-23 NOTE — Telephone Encounter (Signed)
Called lmom informing patient of appointment. klh 

## 2018-12-25 ENCOUNTER — Ambulatory Visit: Payer: Medicaid Other | Admitting: Adult Health

## 2018-12-30 ENCOUNTER — Telehealth: Payer: Self-pay

## 2018-12-30 NOTE — Telephone Encounter (Signed)
BILLED PATIENT MISSED FEE 12/25/18

## 2019-01-30 ENCOUNTER — Telehealth: Payer: Self-pay

## 2019-01-30 NOTE — Telephone Encounter (Signed)
CONFIRMED AND SCREENED FOR 02-04-19 OV. PT ADVISED AND AWARE OF MISSED APPOINTMENT FEE.

## 2019-02-04 ENCOUNTER — Ambulatory Visit: Payer: Medicaid Other | Admitting: Adult Health

## 2019-02-04 ENCOUNTER — Encounter: Payer: Self-pay | Admitting: Adult Health

## 2019-02-04 ENCOUNTER — Other Ambulatory Visit: Payer: Self-pay

## 2019-02-04 VITALS — BP 125/93 | HR 70 | Temp 98.0°F | Resp 16 | Ht 63.0 in | Wt 139.0 lb

## 2019-02-04 DIAGNOSIS — R41840 Attention and concentration deficit: Secondary | ICD-10-CM

## 2019-02-04 DIAGNOSIS — R221 Localized swelling, mass and lump, neck: Secondary | ICD-10-CM | POA: Diagnosis not present

## 2019-02-04 DIAGNOSIS — G43901 Migraine, unspecified, not intractable, with status migrainosus: Secondary | ICD-10-CM

## 2019-02-04 MED ORDER — AMPHETAMINE-DEXTROAMPHETAMINE 10 MG PO TABS: 10.0000 mg | ORAL_TABLET | Freq: Every day | ORAL | 0 refills | Status: DC

## 2019-02-04 NOTE — Progress Notes (Signed)
[facility] [place], [place] [employer] [address]  Internal MEDICINE  Office Visit Note  Patient Name: Carolyn Edwards  [id number]  [id number]  Date of Service: 02/04/2019  Chief Complaint  Patient presents with  . ADHD    HPI  Pt is here for follow up on ADD and Migraines. She is currently doing well, her migraines have gotten better.  She reports some minor headaches. Her ADD is well controlled Neck mass, which could be lymph node.  Has been swollen and enlarging for a few months.  Pt is agreeable to [place] at this time. Denise fever, sore throat or other symptoms.   Current Medication: Outpatient Encounter Medications as of 02/04/2019  Medication Sig  . amphetamine-dextroamphetamine (ADDERALL) 10 MG tablet Take 1 tablet (10 mg total) by mouth daily.  . Aspirin-Acetaminophen-Caffeine (GOODY HEADACHE PO) Take 1 packet by mouth daily as needed (headaches).  . butalbital-acetaminophen-caffeine (FIORICET) 50-325-40 MG tablet Take 1-2 tablets by mouth every 6 (six) hours as needed for headache.  . ibuprofen (ADVIL,MOTRIN) 600 MG tablet Take 1 tablet (600 mg total) by mouth every 6 (six) hours as needed.  . Multiple Vitamins-Minerals (HAIR SKIN AND NAILS FORMULA) TABS Take 3 tablets by mouth daily.  Marland Kitchen Ubrogepant (UBRELVY) 50 MG TABS Take 1-2 tablets by mouth as needed.  . valACYclovir (VALTREX) 1000 MG tablet Take 1 tablet (1,000 mg total) by mouth 2 (two) times daily.  . [DISCONTINUED] amphetamine-dextroamphetamine (ADDERALL) 10 MG tablet Take 1 tablet (10 mg total) by mouth daily.  . [DISCONTINUED] amphetamine-dextroamphetamine (ADDERALL) 10 MG tablet Take 1 tablet (10 mg total) by mouth daily.  . [DISCONTINUED] amphetamine-dextroamphetamine (ADDERALL) 10 MG tablet Take 1 tablet (10 mg total) by mouth daily.  Derrill Memo ON 03/06/2019] amphetamine-dextroamphetamine (ADDERALL) 10 MG tablet Take 1 tablet (10 mg total) by mouth daily.   No facility-administered encounter  medications on file as of 02/04/2019.    Surgical History: Past Surgical History:  Procedure Laterality Date  . BREAST ENHANCEMENT SURGERY    . COLPOSCOPY  01/2005   bxs neg  . FOREIGN BODY REMOVAL N/A 03/22/2017   Procedure: FOREIGN BODY REMOVAL;  Surgeon: Malachy Mood, MD;  Location: [facility] ORS;  Service: Gynecology;  Laterality: N/A;  . INTRAUTERINE DEVICE (IUD) INSERTION  08/2006   Mirena    Medical History: Past Medical History:  Diagnosis Date  . Dysmetabolic syndrome X 98/33/8250  . Genital warts   . LGSIL on Pap smear of cervix 2006  . Migraine   . Ovarian cyst 11/23/2015  . Thyroid nodule     Family History: Family History  Problem Relation Age of Onset  . Diabetes Mother   . Colon cancer Maternal Grandfather 66    Social History   Socioeconomic History  . Marital status: Married    Spouse name: Not on file  . Number of children: Not on file  . Years of education: Not on file  . Highest education level: Not on file  Occupational History  . Not on file  Tobacco Use  . Smoking status: Never Smoker  . Smokeless tobacco: Never Used  Substance and Sexual Activity  . Alcohol use: No  . Drug use: No  . Sexual activity: Not Currently    Birth control/protection: I.U.D.    Comment: pt has IUD surgically removed  Other Topics Concern  . Not on file  Social History Narrative  . Not on file   Social Determinants of Health   Financial Resource Strain:   .  Difficulty of Paying Living Expenses: Not on file  Food Insecurity:   . Worried About Programme researcher, broadcasting/film/video in the Last Year: Not on file  . Ran Out of Food in the Last Year: Not on file  Transportation Needs:   . Lack of Transportation (Medical): Not on file  . Lack of Transportation (Non-Medical): Not on file  Physical Activity:   . Days of Exercise per Week: Not on file  . Minutes of Exercise per Session: Not on file  Stress:   . Feeling of Stress : Not on file  Social Connections:   . Frequency  of Communication with Friends and Family: Not on file  . Frequency of Social Gatherings with Friends and Family: Not on file  . Attends Religious Services: Not on file  . Active Member of Clubs or Organizations: Not on file  . Attends Banker Meetings: Not on file  . Marital Status: Not on file  Intimate Partner Violence:   . Fear of Current or Ex-Partner: Not on file  . Emotionally Abused: Not on file  . Physically Abused: Not on file  . Sexually Abused: Not on file      Review of Systems  Constitutional: Negative for chills, fatigue and unexpected weight change.  HENT: Negative for congestion, rhinorrhea, sneezing and sore throat.   Eyes: Negative for photophobia, pain and redness.  Respiratory: Negative for cough, chest tightness and shortness of breath.   Cardiovascular: Negative for chest pain and palpitations.  Gastrointestinal: Negative for abdominal pain, constipation, diarrhea, nausea and vomiting.  Endocrine: Negative.   Genitourinary: Negative for dysuria and frequency.  Musculoskeletal: Negative for arthralgias, back pain, joint swelling and neck pain.  Skin: Negative for rash.  Allergic/Immunologic: Negative.   Neurological: Negative for tremors and numbness.  Hematological: Negative for adenopathy. Does not bruise/bleed easily.  Psychiatric/Behavioral: Negative for behavioral problems and sleep disturbance. The patient is not nervous/anxious.     Vital Signs: BP (!) 125/93   Pulse 70   Temp 98 F (36.7 C)   Resp 16   Ht 5\' 3"  (1.6 m)   Wt 139 lb (63 kg)   SpO2 99%   BMI 24.62 kg/m    Physical Exam Vitals and nursing note reviewed.  Constitutional:      General: She is not in acute distress.    Appearance: She is well-developed. She is not diaphoretic.  HENT:     Head: Normocephalic and atraumatic.     Mouth/Throat:     Pharynx: No oropharyngeal exudate.  Eyes:     Pupils: Pupils are equal, round, and reactive to light.  Neck:      Thyroid: No thyromegaly.     Vascular: No JVD.     Trachea: No tracheal deviation.     Comments: Small mass, anterior cervical lymph node likely. Cardiovascular:     Rate and Rhythm: Normal rate and regular rhythm.     Heart sounds: Normal heart sounds. No murmur. No friction rub. No gallop.   Pulmonary:     Effort: Pulmonary effort is normal. No respiratory distress.     Breath sounds: Normal breath sounds. No wheezing or rales.  Chest:     Chest wall: No tenderness.  Abdominal:     Palpations: Abdomen is soft.     Tenderness: There is no abdominal tenderness. There is no guarding.  Musculoskeletal:        General: Normal range of motion.     Cervical back: Normal range  of motion and neck supple.  Lymphadenopathy:     Cervical: No cervical adenopathy.  Skin:    General: Skin is warm and dry.  Neurological:     Mental Status: She is alert and oriented to person, place, and time.     Cranial Nerves: No cranial nerve deficit.  Psychiatric:        Behavior: Behavior normal.        Thought Content: Thought content normal.        Judgment: Judgment normal.    Assessment/Plan: 1. Neck mass Get [place] of mass, and follow up. - US Soft Tissue Head/Neck; Future  2. Attention and concentration deficit Refilled Controlled medications today. Reviewed risks and possible side effects associated with taking Stimulants. Combination of these drugs with other psychotropic medications could cause dizziness and drowsiness. Pt needs to Monitor symptoms and exercise caution in driving and operating heavy machinery to avoid damages to oneself, to others and to the surroundings. Patient verbalized understanding in this matter. Dependence and abuse for these drugs will be monitored closely. A Controlled substance policy and procedure is on file which allows [facility] to order a urine drug screen test at any visit. Patient understands and agrees with the plan.. - amphetamine-dextroamphetamine  (ADDERALL) 10 MG tablet; Take 1 tablet (10 mg total) by mouth daily.  Dispense: 30 tablet; Refill: 0 - amphetamine-dextroamphetamine (ADDERALL) 10 MG tablet; Take 1 tablet (10 mg total) by mouth daily.  Dispense: 30 tablet; Refill: 0  3. Migraine with status migrainosus, not intractable, unspecified migraine type Doing well, using ubrelvy as needed  General Counseling: [place] verbalizes understanding of the findings of todays visit and agrees with plan of treatment. I have discussed any further diagnostic evaluation that may be needed or ordered today. We also reviewed her medications today. she has been encouraged to call the office with any questions or concerns that should arise related to todays visit.    Orders Placed This Encounter  Procedures  . US Soft Tissue Head/Neck    Meds ordered this encounter  Medications  . amphetamine-dextroamphetamine (ADDERALL) 10 MG tablet    Sig: Take 1 tablet (10 mg total) by mouth daily.    Dispense:  30 tablet    Refill:  0  . amphetamine-dextroamphetamine (ADDERALL) 10 MG tablet    Sig: Take 1 tablet (10 mg total) by mouth daily.    Dispense:  30 tablet    Refill:  0    Do not fill before 03/06/2019    Time spent: 25 Minutes   This patient was seen by Blima Ledger AGNP-C in Collaboration with Dr Lyndon Code as a part of collaborative care agreement     Johnna Acosta AGNP-C Internal medicine

## 2019-02-11 ENCOUNTER — Ambulatory Visit: Payer: Medicaid Other | Admitting: Obstetrics and Gynecology

## 2019-02-19 ENCOUNTER — Telehealth: Payer: Self-pay

## 2019-02-19 NOTE — Telephone Encounter (Signed)
Confirmed patient ultrasound for 02/21/2019 

## 2019-02-21 ENCOUNTER — Other Ambulatory Visit: Payer: Self-pay

## 2019-02-21 ENCOUNTER — Ambulatory Visit: Payer: Medicaid Other

## 2019-02-21 DIAGNOSIS — R221 Localized swelling, mass and lump, neck: Secondary | ICD-10-CM

## 2019-02-27 ENCOUNTER — Ambulatory Visit: Payer: Medicaid Other | Admitting: Adult Health

## 2019-02-27 ENCOUNTER — Ambulatory Visit: Payer: Medicaid Other | Admitting: Obstetrics and Gynecology

## 2019-02-28 ENCOUNTER — Telehealth: Payer: Self-pay

## 2019-02-28 ENCOUNTER — Other Ambulatory Visit: Payer: Medicaid Other

## 2019-02-28 NOTE — Telephone Encounter (Signed)
Called confirmed virtual visit with patient. klh 

## 2019-03-04 ENCOUNTER — Encounter: Payer: Self-pay | Admitting: Adult Health

## 2019-03-04 ENCOUNTER — Ambulatory Visit: Payer: Medicaid Other | Admitting: Adult Health

## 2019-03-04 VITALS — Ht 63.0 in | Wt 137.0 lb

## 2019-03-04 DIAGNOSIS — B001 Herpesviral vesicular dermatitis: Secondary | ICD-10-CM

## 2019-03-04 DIAGNOSIS — R591 Generalized enlarged lymph nodes: Secondary | ICD-10-CM

## 2019-03-04 MED ORDER — VALACYCLOVIR HCL 1 G PO TABS: 1000.0000 mg | ORAL_TABLET | Freq: Two times a day (BID) | ORAL | 1 refills | Status: DC

## 2019-03-04 NOTE — Progress Notes (Signed)
[facility] [place], [place] [address]  Internal MEDICINE  Telephone Visit  Patient Name: Carolyn Edwards  [id number]  [id number]  Date of Service: 03/04/2019  I connected with the patient at [phone] by telephone and verified the patients identity using two identifiers.   I discussed the limitations, risks, security and privacy concerns of performing an evaluation and management service by telephone and the availability of in person appointments. I also discussed with the patient that there may be a patient responsible charge related to the service.  The patient expressed understanding and agrees to proceed.    Chief Complaint  Patient presents with  . Telephone Assessment  . Telephone Screen  . Follow-up    U/S    HPI  Pt is seen via video to follow up on [place].  She had an US done, see report below.  Further work up is needed at this time.  She reports the mass has not changed, and does not feel different to her.  Will get blood work and repeat US in one month. She is concerned about Lupus, as her mother was diagnosed in her 25's.  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  She has a new fever blister on her lip, and needs a refill of her  Valtrex.    Current Medication: Outpatient Encounter Medications as of 03/04/2019  Medication Sig  . amphetamine-dextroamphetamine (ADDERALL) 10 MG tablet Take 1 tablet (10 mg total) by mouth daily.  Derrill Memo ON 03/06/2019] amphetamine-dextroamphetamine (ADDERALL) 10 MG tablet Take 1 tablet (10 mg total) by mouth daily.  . Aspirin-Acetaminophen-Caffeine (GOODY HEADACHE PO) Take 1 packet by mouth daily as needed (headaches).  . butalbital-acetaminophen-caffeine (FIORICET) 50-325-40 MG tablet Take 1-2 tablets by mouth every 6 (six) hours as needed for headache.  . ibuprofen (ADVIL,MOTRIN) 600 MG tablet Take 1 tablet (600 mg total) by mouth every 6 (six) hours as needed.  . Multiple Vitamins-Minerals  (HAIR SKIN AND NAILS FORMULA) TABS Take 3 tablets by mouth daily.  Marland Kitchen Ubrogepant (UBRELVY) 50 MG TABS Take 1-2 tablets by mouth as needed.  . valACYclovir (VALTREX) 1000 MG tablet Take 1 tablet (1,000 mg total) by mouth 2 (two) times daily.  . [DISCONTINUED] valACYclovir (VALTREX) 1000 MG tablet Take 1 tablet (1,000 mg total) by mouth 2 (two) times daily.   No facility-administered encounter medications on file as of 03/04/2019.    Surgical History: Past Surgical History:  Procedure Laterality Date  . BREAST ENHANCEMENT SURGERY    . COLPOSCOPY  01/2005   bxs neg  . FOREIGN BODY REMOVAL N/A 03/22/2017   Procedure: FOREIGN BODY REMOVAL;  Surgeon: Malachy Mood, MD;  Location: [facility] ORS;  Service: Gynecology;  Laterality: N/A;  . INTRAUTERINE DEVICE (IUD) INSERTION  08/2006   Mirena    Medical History: Past Medical History:  Diagnosis Date  . Dysmetabolic syndrome X 20/94/7096  . Genital warts   . LGSIL on Pap smear of cervix 2006  . Migraine   . Ovarian cyst 11/23/2015  . Thyroid nodule     Family History: Family History  Problem Relation Age of Onset  . Diabetes Mother   . Colon cancer Maternal Grandfather 17    Social History   Socioeconomic History  . Marital status: Married    Spouse name: Not on file  . Number of children: Not on file  . Years of education: Not on file  . Highest education level: Not on file  Occupational History  . Not on file  Tobacco Use  . Smoking status: Never Smoker  . Smokeless tobacco: Never Used  Substance and Sexual Activity  . Alcohol use: No  . Drug use: No  . Sexual activity: Not Currently    Birth control/protection: I.U.D.    Comment: pt has IUD surgically removed  Other Topics Concern  . Not on file  Social History Narrative  . Not on file   Social Determinants of Health   Financial Resource Strain:   . Difficulty of Paying Living Expenses: Not on file  Food Insecurity:   . Worried About Charity fundraiser in the  Last Year: Not on file  . Ran Out of Food in the Last Year: Not on file  Transportation Needs:   . Lack of Transportation (Medical): Not on file  . Lack of Transportation (Non-Medical): Not on file  Physical Activity:   . Days of Exercise per Week: Not on file  . Minutes of Exercise per Session: Not on file  Stress:   . Feeling of Stress : Not on file  Social Connections:   . Frequency of Communication with Friends and Family: Not on file  . Frequency of Social Gatherings with Friends and Family: Not on file  . Attends Religious Services: Not on file  . Active Member of Clubs or Organizations: Not on file  . Attends Archivist Meetings: Not on file  . Marital Status: Not on file  Intimate Partner Violence:   . Fear of Current or Ex-Partner: Not on file  . Emotionally Abused: Not on file  . Physically Abused: Not on file  . Sexually Abused: Not on file      Review of Systems  Constitutional: Negative for chills, fatigue and unexpected weight change.  HENT: Negative for congestion, rhinorrhea, sneezing and sore throat.   Eyes: Negative for photophobia, pain and redness.  Respiratory: Negative for cough, chest tightness and shortness of breath.   Cardiovascular: Negative for chest pain and palpitations.  Gastrointestinal: Negative for abdominal pain, constipation, diarrhea, nausea and vomiting.  Endocrine: Negative.   Genitourinary: Negative for dysuria and frequency.  Musculoskeletal: Negative for arthralgias, back pain, joint swelling and neck pain.  Skin: Negative for rash.  Allergic/Immunologic: Negative.   Neurological: Negative for tremors and numbness.  Hematological: Negative for adenopathy. Does not bruise/bleed easily.  Psychiatric/Behavioral: Negative for behavioral problems and sleep disturbance. The patient is not nervous/anxious.     Vital Signs: Ht '5\' 3"'  (1.6 m)   Wt 137 lb (62.1 kg)   BMI 24.27 kg/m    Observation/Objective:  Well appearing,  NAD noted.    Assessment/Plan: 1. Lymphadenopathy Pt will have blood work drawn, and repeat US in one month. Follow up after [place]. - ANA Direct w/Reflex if Positive - Sed Rate (ESR) - CBC w/Diff/Platelet - US Soft Tissue Head/Neck; Future  2. Fever blister Refilled valtrex at this time.  - valACYclovir (VALTREX) 1000 MG tablet; Take 1 tablet (1,000 mg total) by mouth 2 (two) times daily.  Dispense: 20 tablet; Refill: 1  General Counseling: Carolyn Edwards verbalizes understanding of the findings of today's phone visit and agrees with plan of treatment. I have discussed any further diagnostic evaluation that may be needed or ordered today. We also reviewed her medications today. she has been encouraged to call the office with any questions or concerns that should arise related to todays visit.    Orders Placed This Encounter  Procedures  . [place] Soft  Tissue Head/Neck  . ANA Direct w/Reflex if Positive  . Sed Rate (ESR)  . CBC w/Diff/Platelet    Meds ordered this encounter  Medications  . valACYclovir (VALTREX) 1000 MG tablet    Sig: Take 1 tablet (1,000 mg total) by mouth 2 (two) times daily.    Dispense:  20 tablet    Refill:  1    Time spent: [place] AGNP-C Internal medicine

## 2019-03-06 LAB — CBC WITH DIFFERENTIAL/PLATELET
Basophils Absolute: 0 10*3/uL (ref 0.0–0.2)
Basos: 1 %
EOS (ABSOLUTE): 0.4 10*3/uL (ref 0.0–0.4)
Eos: 9 %
Hematocrit: 36.8 % (ref 34.0–46.6)
Hemoglobin: 12.9 g/dL (ref 11.1–15.9)
Immature Grans (Abs): 0 10*3/uL (ref 0.0–0.1)
Immature Granulocytes: 0 %
Lymphocytes Absolute: 1.2 10*3/uL (ref 0.7–3.1)
Lymphs: 26 %
MCH: 31.2 pg (ref 26.6–33.0)
MCHC: 35.1 g/dL (ref 31.5–35.7)
MCV: 89 fL (ref 79–97)
Monocytes Absolute: 0.4 10*3/uL (ref 0.1–0.9)
Monocytes: 9 %
Neutrophils Absolute: 2.6 10*3/uL (ref 1.4–7.0)
Neutrophils: 55 %
Platelets: 209 10*3/uL (ref 150–450)
RBC: 4.14 x10E6/uL (ref 3.77–5.28)
RDW: 11.8 % (ref 11.7–15.4)
WBC: 4.7 10*3/uL (ref 3.4–10.8)

## 2019-03-06 LAB — SEDIMENTATION RATE: Sed Rate: 2 mm/hr (ref 0–32)

## 2019-03-06 LAB — ANA W/REFLEX IF POSITIVE: Anti Nuclear Antibody (ANA): NEGATIVE

## 2019-03-18 ENCOUNTER — Other Ambulatory Visit: Payer: Self-pay

## 2019-03-18 DIAGNOSIS — B001 Herpesviral vesicular dermatitis: Secondary | ICD-10-CM

## 2019-03-18 MED ORDER — VALACYCLOVIR HCL 1 G PO TABS: 1000.0000 mg | ORAL_TABLET | Freq: Two times a day (BID) | ORAL | 1 refills | Status: DC

## 2019-03-28 ENCOUNTER — Other Ambulatory Visit: Payer: Medicaid Other

## 2019-04-01 ENCOUNTER — Ambulatory Visit: Payer: Medicaid Other | Admitting: Adult Health

## 2019-04-04 ENCOUNTER — Other Ambulatory Visit: Payer: Self-pay

## 2019-04-04 ENCOUNTER — Ambulatory Visit: Payer: Medicaid Other

## 2019-04-04 DIAGNOSIS — R591 Generalized enlarged lymph nodes: Secondary | ICD-10-CM

## 2019-04-10 ENCOUNTER — Telehealth: Payer: Self-pay

## 2019-04-10 NOTE — Telephone Encounter (Signed)
Confirmed appointment on 04/14/2019 and screened for covid. klh °

## 2019-04-14 ENCOUNTER — Ambulatory Visit: Payer: Medicaid Other | Admitting: Adult Health

## 2019-04-14 ENCOUNTER — Encounter: Payer: Self-pay | Admitting: Adult Health

## 2019-04-14 VITALS — Resp 16 | Ht 63.0 in | Wt 135.0 lb

## 2019-04-14 DIAGNOSIS — R221 Localized swelling, mass and lump, neck: Secondary | ICD-10-CM | POA: Diagnosis not present

## 2019-04-14 DIAGNOSIS — R41840 Attention and concentration deficit: Secondary | ICD-10-CM | POA: Diagnosis not present

## 2019-04-14 MED ORDER — AMPHETAMINE-DEXTROAMPHETAMINE 10 MG PO TABS: 10.0000 mg | ORAL_TABLET | Freq: Every day | ORAL | 0 refills | Status: DC

## 2019-04-14 NOTE — Progress Notes (Signed)
[facility] [address] [place], [place] [address]  Internal MEDICINE  Telephone Visit  Patient Name: Carolyn Edwards  [id number]  [id number]  Date of Service: 04/14/2019  I connected with the patient at [phone] by telephone and verified the patients identity using two identifiers.   I discussed the limitations, risks, security and privacy concerns of performing an evaluation and management service by telephone and the availability of in person appointments. I also discussed with the patient that there may be a patient responsible charge related to the service.  The patient expressed understanding and agrees to proceed.    Chief Complaint  Patient presents with  . Telephone Assessment  . Telephone Screen  . Follow-up    [place] results  . Medication Refill    add    HPI  Pt is seen today via telephone for follow up on ADD, as well well Ultrasound follow up. Her Ultrasound was benign for acute findings.  She continues to take Adderall  For attention and concentration defficit.  She denies any side effects at this time.  She currently takes 10mg  daily.  Her blood work was normal, see flow sheet.     Current Medication: Outpatient Encounter Medications as of 04/14/2019  Medication Sig  . amphetamine-dextroamphetamine (ADDERALL) 10 MG tablet Take 1 tablet (10 mg total) by mouth daily.  . Aspirin-Acetaminophen-Caffeine (GOODY HEADACHE PO) Take 1 packet by mouth daily as needed (headaches).  . butalbital-acetaminophen-caffeine (FIORICET) 50-325-40 MG tablet Take 1-2 tablets by mouth every 6 (six) hours as needed for headache.  . ibuprofen (ADVIL,MOTRIN) 600 MG tablet Take 1 tablet (600 mg total) by mouth every 6 (six) hours as needed.  . Multiple Vitamins-Minerals (HAIR SKIN AND NAILS FORMULA) TABS Take 3 tablets by mouth daily.  04/16/2019 Ubrogepant (UBRELVY) 50 MG TABS Take 1-2 tablets by mouth as needed.  . valACYclovir (VALTREX) 1000 MG tablet Take 1 tablet (1,000 mg total) by  mouth 2 (two) times daily.  . [DISCONTINUED] amphetamine-dextroamphetamine (ADDERALL) 10 MG tablet Take 1 tablet (10 mg total) by mouth daily.  Marland Kitchen amphetamine-dextroamphetamine (ADDERALL) 10 MG tablet Take 1 tablet (10 mg total) by mouth daily.  Marland Kitchen amphetamine-dextroamphetamine (ADDERALL) 10 MG tablet Take 1 tablet (10 mg total) by mouth daily.   No facility-administered encounter medications on file as of 04/14/2019.    Surgical History: Past Surgical History:  Procedure Laterality Date  . BREAST ENHANCEMENT SURGERY    . COLPOSCOPY  01/2005   bxs neg  . FOREIGN BODY REMOVAL N/A 03/22/2017   Procedure: FOREIGN BODY REMOVAL;  Surgeon: 03/24/2017, MD;  Location: [facility] ORS;  Service: Gynecology;  Laterality: N/A;  . INTRAUTERINE DEVICE (IUD) INSERTION  08/2006   Mirena    Medical History: Past Medical History:  Diagnosis Date  . Dysmetabolic syndrome X 03/23/2011  . Genital warts   . LGSIL on Pap smear of cervix 2006  . Migraine   . Ovarian cyst 11/23/2015  . Thyroid nodule     Family History: Family History  Problem Relation Age of Onset  . Diabetes Mother   . Colon cancer Maternal Grandfather 68    Social History   Socioeconomic History  . Marital status: Married    Spouse name: Not on file  . Number of children: Not on file  . Years of education: Not on file  . Highest education level: Not on file  Occupational History  . Not on file  Tobacco Use  . Smoking status: Never Smoker  .  Smokeless tobacco: Never Used  Substance and Sexual Activity  . Alcohol use: No  . Drug use: No  . Sexual activity: Not Currently    Birth control/protection: I.U.D.    Comment: pt has IUD surgically removed  Other Topics Concern  . Not on file  Social History Narrative  . Not on file   Social Determinants of Health   Financial Resource Strain:   . Difficulty of Paying Living Expenses:   Food Insecurity:   . Worried About Programme researcher, broadcasting/film/video in the Last Year:   . Garment/textile technologist in the Last Year:   Transportation Needs:   . Freight forwarder (Medical):   Marland Kitchen Lack of Transportation (Non-Medical):   Physical Activity:   . Days of Exercise per Week:   . Minutes of Exercise per Session:   Stress:   . Feeling of Stress :   Social Connections:   . Frequency of Communication with Friends and Family:   . Frequency of Social Gatherings with Friends and Family:   . Attends Religious Services:   . Active Member of Clubs or Organizations:   . Attends Banker Meetings:   Marland Kitchen Marital Status:   Intimate Partner Violence:   . Fear of Current or Ex-Partner:   . Emotionally Abused:   Marland Kitchen Physically Abused:   . Sexually Abused:       Review of Systems  Constitutional: Negative for chills, fatigue and unexpected weight change.  HENT: Negative for congestion, rhinorrhea, sneezing and sore throat.   Eyes: Negative for photophobia, pain and redness.  Respiratory: Negative for cough, chest tightness and shortness of breath.   Cardiovascular: Negative for chest pain and palpitations.  Gastrointestinal: Negative for abdominal pain, constipation, diarrhea, nausea and vomiting.  Endocrine: Negative.   Genitourinary: Negative for dysuria and frequency.  Musculoskeletal: Negative for arthralgias, back pain, joint swelling and neck pain.  Skin: Negative for rash.  Allergic/Immunologic: Negative.   Neurological: Negative for tremors and numbness.  Hematological: Negative for adenopathy. Does not bruise/bleed easily.  Psychiatric/Behavioral: Negative for behavioral problems and sleep disturbance. The patient is not nervous/anxious.     Vital Signs: Resp 16   Ht 5\' 3"  (1.6 m)   Wt 135 lb (61.2 kg)   BMI 23.91 kg/m    Observation/Objective:  Well sounding, NAD noted.   Assessment/Plan: 1. Attention and concentration deficit Refilled Controlled medications today. Reviewed risks and possible side effects associated with taking Stimulants. Combination  of these drugs with other psychotropic medications could cause dizziness and drowsiness. Pt needs to Monitor symptoms and exercise caution in driving and operating heavy machinery to avoid damages to oneself, to others and to the surroundings. Patient verbalized understanding in this matter. Dependence and abuse for these drugs will be monitored closely. A Controlled substance policy and procedure is on file which allows [facility] to order a urine drug screen test at any visit. Patient understands and agrees with the plan.. - amphetamine-dextroamphetamine (ADDERALL) 10 MG tablet; Take 1 tablet (10 mg total) by mouth daily.  Dispense: 30 tablet; Refill: 0 - amphetamine-dextroamphetamine (ADDERALL) 10 MG tablet; Take 1 tablet (10 mg total) by mouth daily.  Dispense: 30 tablet; Refill: 0  2. Neck mass [place] done for follow up.  Benign at this time.  Continue to follow.   General Counseling: haeley fordham understanding of the findings of today's phone visit and agrees with plan of treatment. I have discussed any further diagnostic evaluation that may be  needed or ordered today. We also reviewed her medications today. she has been encouraged to call the office with any questions or concerns that should arise related to todays visit.    No orders of the defined types were placed in this encounter.   Meds ordered this encounter  Medications  . amphetamine-dextroamphetamine (ADDERALL) 10 MG tablet    Sig: Take 1 tablet (10 mg total) by mouth daily.    Dispense:  30 tablet    Refill:  0  . amphetamine-dextroamphetamine (ADDERALL) 10 MG tablet    Sig: Take 1 tablet (10 mg total) by mouth daily.    Dispense:  30 tablet    Refill:  0    Time spent: [place] AGNP-C Internal medicine

## 2019-06-10 ENCOUNTER — Telehealth: Payer: Self-pay

## 2019-06-10 NOTE — Telephone Encounter (Signed)
Confirmed appointment on 06/12/2019 and screened for covid. klh 

## 2019-06-12 ENCOUNTER — Ambulatory Visit: Payer: Medicaid Other | Admitting: Adult Health

## 2019-06-12 ENCOUNTER — Encounter: Payer: Self-pay | Admitting: Adult Health

## 2019-06-12 ENCOUNTER — Other Ambulatory Visit: Payer: Self-pay

## 2019-06-12 VITALS — BP 111/85 | HR 69 | Temp 97.8°F | Resp 16 | Ht 63.0 in | Wt 136.8 lb

## 2019-06-12 DIAGNOSIS — Z79899 Other long term (current) drug therapy: Secondary | ICD-10-CM | POA: Diagnosis not present

## 2019-06-12 DIAGNOSIS — R41840 Attention and concentration deficit: Secondary | ICD-10-CM

## 2019-06-12 DIAGNOSIS — G43901 Migraine, unspecified, not intractable, with status migrainosus: Secondary | ICD-10-CM | POA: Diagnosis not present

## 2019-06-12 LAB — POCT URINE DRUG SCREEN
POC Amphetamine UR: POSITIVE — AB
POC BENZODIAZEPINES UR: NOT DETECTED
POC Barbiturate UR: NOT DETECTED
POC Cocaine UR: NOT DETECTED
POC Ecstasy UR: NOT DETECTED
POC Marijuana UR: NOT DETECTED
POC Methadone UR: NOT DETECTED
POC Methamphetamine UR: NOT DETECTED
POC Opiate Ur: NOT DETECTED
POC Oxycodone UR: NOT DETECTED
POC PHENCYCLIDINE UR: NOT DETECTED
POC TRICYCLICS UR: NOT DETECTED

## 2019-06-12 MED ORDER — AMPHETAMINE-DEXTROAMPHETAMINE 10 MG PO TABS: 10.0000 mg | ORAL_TABLET | Freq: Every day | ORAL | 0 refills | Status: DC

## 2019-06-12 MED ORDER — BUTALBITAL-APAP-CAFFEINE 50-325-40 MG PO TABS: 1.0000 | ORAL_TABLET | Freq: Four times a day (QID) | ORAL | 0 refills | Status: DC | PRN

## 2019-06-12 NOTE — Progress Notes (Signed)
[facility] [address] [place], [place] [address]  Internal MEDICINE  Office Visit Note  Patient Name: Carolyn Edwards  [id number]  [id number]  Date of Service: 06/12/2019  Chief Complaint  Patient presents with  . Follow-up    HPI  Pt is here for follow up on on ADD and Migraines.  Overall she is doing well. She has good control of symptoms with Adderall.  She currently takes 10mg  daily.  She reports her migraines have been stable.  She used her last Fioricet this week, that was filled in august. She is requiting refill at this time.   Current Medication: Outpatient Encounter Medications as of 06/12/2019  Medication Sig  . amphetamine-dextroamphetamine (ADDERALL) 10 MG tablet Take 1 tablet (10 mg total) by mouth daily.  . Aspirin-Acetaminophen-Caffeine (GOODY HEADACHE PO) Take 1 packet by mouth daily as needed (headaches).  . butalbital-acetaminophen-caffeine (FIORICET) 50-325-40 MG tablet Take 1-2 tablets by mouth every 6 (six) hours as needed for headache.  . ibuprofen (ADVIL,MOTRIN) 600 MG tablet Take 1 tablet (600 mg total) by mouth every 6 (six) hours as needed.  . Multiple Vitamins-Minerals (HAIR SKIN AND NAILS FORMULA) TABS Take 3 tablets by mouth daily.  06/14/2019 Ubrogepant (UBRELVY) 50 MG TABS Take 1-2 tablets by mouth as needed.  . valACYclovir (VALTREX) 1000 MG tablet Take 1 tablet (1,000 mg total) by mouth 2 (two) times daily.  . [DISCONTINUED] amphetamine-dextroamphetamine (ADDERALL) 10 MG tablet Take 1 tablet (10 mg total) by mouth daily.  . [DISCONTINUED] amphetamine-dextroamphetamine (ADDERALL) 10 MG tablet Take 1 tablet (10 mg total) by mouth daily.  . [DISCONTINUED] amphetamine-dextroamphetamine (ADDERALL) 10 MG tablet Take 1 tablet (10 mg total) by mouth daily.  . [DISCONTINUED] butalbital-acetaminophen-caffeine (FIORICET) 50-325-40 MG tablet Take 1-2 tablets by mouth every 6 (six) hours as needed for headache.  Marland Kitchen ON 07/12/2019]  amphetamine-dextroamphetamine (ADDERALL) 10 MG tablet Take 1 tablet (10 mg total) by mouth daily.   No facility-administered encounter medications on file as of 06/12/2019.    Surgical History: Past Surgical History:  Procedure Laterality Date  . BREAST ENHANCEMENT SURGERY    . COLPOSCOPY  01/2005   bxs neg  . FOREIGN BODY REMOVAL N/A 03/22/2017   Procedure: FOREIGN BODY REMOVAL;  Surgeon: 03/24/2017, MD;  Location: [facility] ORS;  Service: Gynecology;  Laterality: N/A;  . INTRAUTERINE DEVICE (IUD) INSERTION  08/2006   Mirena    Medical History: Past Medical History:  Diagnosis Date  . Dysmetabolic syndrome X 03/23/2011  . Genital warts   . LGSIL on Pap smear of cervix 2006  . Migraine   . Ovarian cyst 11/23/2015  . Thyroid nodule     Family History: Family History  Problem Relation Age of Onset  . Diabetes Mother   . Colon cancer Maternal Grandfather 4    Social History   Socioeconomic History  . Marital status: Married    Spouse name: Not on file  . Number of children: Not on file  . Years of education: Not on file  . Highest education level: Not on file  Occupational History  . Not on file  Tobacco Use  . Smoking status: Never Smoker  . Smokeless tobacco: Never Used  Substance and Sexual Activity  . Alcohol use: No  . Drug use: No  . Sexual activity: Not Currently    Birth control/protection: I.U.D.    Comment: pt has IUD surgically removed  Other Topics Concern  . Not on file  Social History Narrative  .  Not on file   Social Determinants of Health   Financial Resource Strain:   . Difficulty of Paying Living Expenses:   Food Insecurity:   . Worried About Charity fundraiser in the Last Year:   . Arboriculturist in the Last Year:   Transportation Needs:   . Film/video editor (Medical):   Marland Kitchen Lack of Transportation (Non-Medical):   Physical Activity:   . Days of Exercise per Week:   . Minutes of Exercise per Session:   Stress:   . Feeling  of Stress :   Social Connections:   . Frequency of Communication with Friends and Family:   . Frequency of Social Gatherings with Friends and Family:   . Attends Religious Services:   . Active Member of Clubs or Organizations:   . Attends Archivist Meetings:   Marland Kitchen Marital Status:   Intimate Partner Violence:   . Fear of Current or Ex-Partner:   . Emotionally Abused:   Marland Kitchen Physically Abused:   . Sexually Abused:       Review of Systems  Constitutional: Negative for chills, fatigue and unexpected weight change.  HENT: Negative for congestion, rhinorrhea, sneezing and sore throat.   Eyes: Negative for photophobia, pain and redness.  Respiratory: Negative for cough, chest tightness and shortness of breath.   Cardiovascular: Negative for chest pain and palpitations.  Gastrointestinal: Negative for abdominal pain, constipation, diarrhea, nausea and vomiting.  Endocrine: Negative.   Genitourinary: Negative for dysuria and frequency.  Musculoskeletal: Negative for arthralgias, back pain, joint swelling and neck pain.  Skin: Negative for rash.  Allergic/Immunologic: Negative.   Neurological: Negative for tremors and numbness.  Hematological: Negative for adenopathy. Does not bruise/bleed easily.  Psychiatric/Behavioral: Negative for behavioral problems and sleep disturbance. The patient is not nervous/anxious.     Vital Signs: BP 111/85   Pulse 69   Temp 97.8 F (36.6 C)   Resp 16   Ht 5\' 3"  (1.6 m)   Wt 136 lb 12.8 oz (62.1 kg)   SpO2 100%   BMI 24.23 kg/m    Physical Exam Vitals and nursing note reviewed.  Constitutional:      General: She is not in acute distress.    Appearance: She is well-developed. She is not diaphoretic.  HENT:     Head: Normocephalic and atraumatic.     Mouth/Throat:     Pharynx: No oropharyngeal exudate.  Eyes:     Pupils: Pupils are equal, round, and reactive to light.  Neck:     Thyroid: No thyromegaly.     Vascular: No JVD.      Trachea: No tracheal deviation.  Cardiovascular:     Rate and Rhythm: Normal rate and regular rhythm.     Heart sounds: Normal heart sounds. No murmur. No friction rub. No gallop.   Pulmonary:     Effort: Pulmonary effort is normal. No respiratory distress.     Breath sounds: Normal breath sounds. No wheezing or rales.  Chest:     Chest wall: No tenderness.  Abdominal:     Palpations: Abdomen is soft.     Tenderness: There is no abdominal tenderness. There is no guarding.  Musculoskeletal:        General: Normal range of motion.     Cervical back: Normal range of motion and neck supple.  Lymphadenopathy:     Cervical: No cervical adenopathy.  Skin:    General: Skin is warm and dry.  Neurological:  Mental Status: She is alert and oriented to person, place, and time.     Cranial Nerves: No cranial nerve deficit.  Psychiatric:        Behavior: Behavior normal.        Thought Content: Thought content normal.        Judgment: Judgment normal.    Assessment/Plan: 1. Attention and concentration deficit Refilled Controlled medications today. Reviewed risks and possible side effects associated with taking Stimulants. Combination of these drugs with other psychotropic medications could cause dizziness and drowsiness. Pt needs to Monitor symptoms and exercise caution in driving and operating heavy machinery to avoid damages to oneself, to others and to the surroundings. Patient verbalized understanding in this matter. Dependence and abuse for these drugs will be monitored closely. A Controlled substance policy and procedure is on file which allows [facility] to order a urine drug screen test at any visit. Patient understands and agrees with the plan.. - amphetamine-dextroamphetamine (ADDERALL) 10 MG tablet; Take 1 tablet (10 mg total) by mouth daily.  Dispense: 30 tablet; Refill: 0 - amphetamine-dextroamphetamine (ADDERALL) 10 MG tablet; Take 1 tablet (10 mg total) by mouth  daily.  Dispense: 30 tablet; Refill: 0  2. Migraine with status migrainosus, not intractable, unspecified migraine type Reviewed risks and possible side effects associated with taking opiates, benzodiazepines and other CNS depressants. Combination of these could cause dizziness and drowsiness. Advised patient not to drive or operate machinery when taking these medications, as patient's and other's life can be at risk and will have consequences. Patient verbalized understanding in this matter. Dependence and abuse for these drugs will be monitored closely. A Controlled substance policy and procedure is on file which allows [facility] to order a urine drug screen test at any visit. Patient understands and agrees with the plan - butalbital-acetaminophen-caffeine (FIORICET) 50-325-40 MG tablet; Take 1-2 tablets by mouth every 6 (six) hours as needed for headache.  Dispense: 30 tablet; Refill: 0  General Counseling: Dunia verbalizes understanding of the findings of todays visit and agrees with plan of treatment. I have discussed any further diagnostic evaluation that Carolyn be needed or ordered today. We also reviewed her medications today. she has been encouraged to call the office with any questions or concerns that should arise related to todays visit.    No orders of the defined types were placed in this encounter.   Meds ordered this encounter  Medications  . butalbital-acetaminophen-caffeine (FIORICET) 50-325-40 MG tablet    Sig: Take 1-2 tablets by mouth every 6 (six) hours as needed for headache.    Dispense:  30 tablet    Refill:  0  . amphetamine-dextroamphetamine (ADDERALL) 10 MG tablet    Sig: Take 1 tablet (10 mg total) by mouth daily.    Dispense:  30 tablet    Refill:  0  . amphetamine-dextroamphetamine (ADDERALL) 10 MG tablet    Sig: Take 1 tablet (10 mg total) by mouth daily.    Dispense:  30 tablet    Refill:  0    Do not fill before 07/12/19    Time spent: 30  Minutes   This patient was seen by Blima Ledger AGNP-C in Collaboration with Dr Lyndon Code as a part of collaborative care agreement     Johnna Acosta AGNP-C Internal medicine

## 2019-08-05 ENCOUNTER — Telehealth: Payer: Self-pay

## 2019-08-05 NOTE — Telephone Encounter (Signed)
Confirmed patient appointment 

## 2019-08-07 ENCOUNTER — Encounter: Payer: Self-pay | Admitting: Adult Health

## 2019-08-07 ENCOUNTER — Ambulatory Visit: Payer: Medicaid Other | Admitting: Adult Health

## 2019-08-07 ENCOUNTER — Other Ambulatory Visit: Payer: Self-pay

## 2019-08-07 VITALS — BP 109/74 | HR 67 | Temp 97.9°F | Resp 16 | Ht 63.0 in | Wt 137.8 lb

## 2019-08-07 DIAGNOSIS — R41840 Attention and concentration deficit: Secondary | ICD-10-CM

## 2019-08-07 DIAGNOSIS — G43901 Migraine, unspecified, not intractable, with status migrainosus: Secondary | ICD-10-CM | POA: Diagnosis not present

## 2019-08-07 MED ORDER — AMPHETAMINE-DEXTROAMPHETAMINE 10 MG PO TABS: 10.0000 mg | ORAL_TABLET | Freq: Two times a day (BID) | ORAL | 0 refills | Status: DC

## 2019-08-07 MED ORDER — AMPHETAMINE-DEXTROAMPHETAMINE 10 MG PO TABS: 10.0000 mg | ORAL_TABLET | Freq: Every day | ORAL | 0 refills | Status: DC

## 2019-08-07 NOTE — Progress Notes (Signed)
[facility] [address] [place], [place] [address]  Internal MEDICINE  Office Visit Note  Patient Name: Carolyn Edwards  [id number]  [id number]  Date of Service: 08/07/2019  Chief Complaint  Patient presents with   Follow-up   Quality Metric Gaps    pap smear, covid shot    HPI  Pt is here for follow up on ADD and migraines.  She reports not having any migraines recently.  She continues to take aderall for ADD and has good control of symptoms.  She continues to take 10mg  daily.   Pt needs Pap smear, and she had an appt with Dr. , he had to cancel due to emergency and she is going to call and reschedule.   Current Medication: Outpatient Encounter Medications as of 08/07/2019  Medication Sig   amphetamine-dextroamphetamine (ADDERALL) 10 MG tablet Take 1 tablet (10 mg total) by mouth daily.   [START ON 08/11/2019] amphetamine-dextroamphetamine (ADDERALL) 10 MG tablet Take 1 tablet (10 mg total) by mouth daily.   Aspirin-Acetaminophen-Caffeine (GOODY HEADACHE PO) Take 1 packet by mouth daily as needed (headaches).   butalbital-acetaminophen-caffeine (FIORICET) 50-325-40 MG tablet Take 1-2 tablets by mouth every 6 (six) hours as needed for headache.   ibuprofen (ADVIL,MOTRIN) 600 MG tablet Take 1 tablet (600 mg total) by mouth every 6 (six) hours as needed.   Multiple Vitamins-Minerals (HAIR SKIN AND NAILS FORMULA) TABS Take 3 tablets by mouth daily.   Ubrogepant (UBRELVY) 50 MG TABS Take 1-2 tablets by mouth as needed.   valACYclovir (VALTREX) 1000 MG tablet Take 1 tablet (1,000 mg total) by mouth 2 (two) times daily.   [DISCONTINUED] amphetamine-dextroamphetamine (ADDERALL) 10 MG tablet Take 1 tablet (10 mg total) by mouth daily.   [START ON 09/11/2019] amphetamine-dextroamphetamine (ADDERALL) 10 MG tablet Take 1 tablet (10 mg total) by mouth 2 (two) times daily.   No facility-administered encounter medications on file as of 08/07/2019.    Surgical  History: Past Surgical History:  Procedure Laterality Date   BREAST ENHANCEMENT SURGERY     COLPOSCOPY  01/2005   bxs neg   FOREIGN BODY REMOVAL N/A 03/22/2017   Procedure: FOREIGN BODY REMOVAL;  Surgeon: 03/24/2017, MD;  Location: [facility] ORS;  Service: Gynecology;  Laterality: N/A;   INTRAUTERINE DEVICE (IUD) INSERTION  08/2006   Mirena    Medical History: Past Medical History:  Diagnosis Date   Dysmetabolic syndrome X 03/23/2011   Genital warts    LGSIL on Pap smear of cervix 2006   Migraine    Ovarian cyst 11/23/2015   Thyroid nodule     Family History: Family History  Problem Relation Age of Onset   Diabetes Mother    Colon cancer Maternal Grandfather 54    Social History   Socioeconomic History   Marital status: Married    Spouse name: Not on file   Number of children: Not on file   Years of education: Not on file   Highest education level: Not on file  Occupational History   Not on file  Tobacco Use   Smoking status: Never Smoker   Smokeless tobacco: Never Used  Vaping Use   Vaping Use: Never used  Substance and Sexual Activity   Alcohol use: No   Drug use: No   Sexual activity: Not Currently    Birth control/protection: I.U.D.    Comment: pt has IUD surgically removed  Other Topics Concern   Not on file  Social History Narrative   Not on file  Social Determinants of Health   Financial Resource Strain:    Difficulty of Paying Living Expenses:   Food Insecurity:    Worried About Programme researcher, broadcasting/film/video in the Last Year:    Barista in the Last Year:   Transportation Needs:    Freight forwarder (Medical):    Lack of Transportation (Non-Medical):   Physical Activity:    Days of Exercise per Week:    Minutes of Exercise per Session:   Stress:    Feeling of Stress :   Social Connections:    Frequency of Communication with Friends and Family:    Frequency of Social Gatherings with Friends and  Family:    Attends Religious Services:    Active Member of Clubs or Organizations:    Attends Engineer, structural:    Marital Status:   Intimate Partner Violence:    Fear of Current or Ex-Partner:    Emotionally Abused:    Physically Abused:    Sexually Abused:       Review of Systems  Constitutional: Negative for chills, fatigue and unexpected weight change.  HENT: Negative for congestion, rhinorrhea, sneezing and sore throat.   Eyes: Negative for photophobia, pain and redness.  Respiratory: Negative for cough, chest tightness and shortness of breath.   Cardiovascular: Negative for chest pain and palpitations.  Gastrointestinal: Negative for abdominal pain, constipation, diarrhea, nausea and vomiting.  Endocrine: Negative.   Genitourinary: Negative for dysuria and frequency.  Musculoskeletal: Negative for arthralgias, back pain, joint swelling and neck pain.  Skin: Negative for rash.  Allergic/Immunologic: Negative.   Neurological: Negative for tremors and numbness.  Hematological: Negative for adenopathy. Does not bruise/bleed easily.  Psychiatric/Behavioral: Negative for behavioral problems and sleep disturbance. The patient is not nervous/anxious.     Vital Signs: BP 109/74    Pulse 67    Temp 97.9 F (36.6 C)    Resp 16    Ht 5\' 3"  (1.6 m)    Wt 137 lb 12.8 oz (62.5 kg)    SpO2 100%    BMI 24.41 kg/m    Physical Exam Vitals and nursing note reviewed.  Constitutional:      General: She is not in acute distress.    Appearance: She is well-developed. She is not diaphoretic.  HENT:     Head: Normocephalic and atraumatic.     Mouth/Throat:     Pharynx: No oropharyngeal exudate.  Eyes:     Pupils: Pupils are equal, round, and reactive to light.  Neck:     Thyroid: No thyromegaly.     Vascular: No JVD.     Trachea: No tracheal deviation.  Cardiovascular:     Rate and Rhythm: Normal rate and regular rhythm.     Heart sounds: Normal heart sounds. No  murmur heard.  No friction rub. No gallop.   Pulmonary:     Effort: Pulmonary effort is normal. No respiratory distress.     Breath sounds: Normal breath sounds. No wheezing or rales.  Chest:     Chest wall: No tenderness.  Abdominal:     Palpations: Abdomen is soft.     Tenderness: There is no abdominal tenderness. There is no guarding.  Musculoskeletal:        General: Normal range of motion.     Cervical back: Normal range of motion and neck supple.  Lymphadenopathy:     Cervical: No cervical adenopathy.  Skin:    General: Skin is warm and  dry.  Neurological:     Mental Status: She is alert and oriented to person, place, and time.     Cranial Nerves: No cranial nerve deficit.  Psychiatric:        Behavior: Behavior normal.        Thought Content: Thought content normal.        Judgment: Judgment normal.    Assessment/Plan: 1. Attention and concentration deficit Refilled Controlled medications today. Reviewed risks and possible side effects associated with taking Stimulants. Combination of these drugs with other psychotropic medications could cause dizziness and drowsiness. Pt needs to Monitor symptoms and exercise caution in driving and operating heavy machinery to avoid damages to oneself, to others and to the surroundings. Patient verbalized understanding in this matter. Dependence and abuse for these drugs will be monitored closely. A Controlled substance policy and procedure is on file which allows [facility] to order a urine drug screen test at any visit. Patient understands and agrees with the plan.. - amphetamine-dextroamphetamine (ADDERALL) 10 MG tablet; Take 1 tablet (10 mg total) by mouth daily.  Dispense: 30 tablet; Refill: 0 - amphetamine-dextroamphetamine (ADDERALL) 10 MG tablet; Take 1 tablet (10 mg total) by mouth 2 (two) times daily.  Dispense: 60 tablet; Refill: 0  2. Migraine with status migrainosus, not intractable, unspecified migraine type Stable,  continue present management.   General Counseling: mashal slavick understanding of the findings of todays visit and agrees with plan of treatment. I have discussed any further diagnostic evaluation that may be needed or ordered today. We also reviewed her medications today. she has been encouraged to call the office with any questions or concerns that should arise related to todays visit.    No orders of the defined types were placed in this encounter.   Meds ordered this encounter  Medications   amphetamine-dextroamphetamine (ADDERALL) 10 MG tablet    Sig: Take 1 tablet (10 mg total) by mouth daily.    Dispense:  30 tablet    Refill:  0    Do not fill before 08/11/2019   amphetamine-dextroamphetamine (ADDERALL) 10 MG tablet    Sig: Take 1 tablet (10 mg total) by mouth 2 (two) times daily.    Dispense:  60 tablet    Refill:  0    Do not fill before 09/11/2019    Time spent: 25 Minutes   This patient was seen by Blima Ledger AGNP-C in Collaboration with Dr Lyndon Code as a part of collaborative care agreement     Johnna Acosta AGNP-C Internal medicine

## 2019-10-01 ENCOUNTER — Ambulatory Visit: Payer: Medicaid Other | Admitting: Hospice and Palliative Medicine

## 2019-10-09 ENCOUNTER — Ambulatory Visit: Payer: Medicaid Other | Admitting: Hospice and Palliative Medicine

## 2019-10-09 ENCOUNTER — Encounter: Payer: Self-pay | Admitting: Hospice and Palliative Medicine

## 2019-10-09 ENCOUNTER — Other Ambulatory Visit: Payer: Self-pay

## 2019-10-09 DIAGNOSIS — J329 Chronic sinusitis, unspecified: Secondary | ICD-10-CM | POA: Diagnosis not present

## 2019-10-09 DIAGNOSIS — Z79899 Other long term (current) drug therapy: Secondary | ICD-10-CM

## 2019-10-09 DIAGNOSIS — R41840 Attention and concentration deficit: Secondary | ICD-10-CM

## 2019-10-09 DIAGNOSIS — B001 Herpesviral vesicular dermatitis: Secondary | ICD-10-CM

## 2019-10-09 DIAGNOSIS — Z0001 Encounter for general adult medical examination with abnormal findings: Secondary | ICD-10-CM

## 2019-10-09 LAB — POCT URINE DRUG SCREEN
POC Amphetamine UR: POSITIVE — AB
POC BENZODIAZEPINES UR: NOT DETECTED
POC Barbiturate UR: NOT DETECTED
POC Cocaine UR: NOT DETECTED
POC Ecstasy UR: NOT DETECTED
POC Marijuana UR: NOT DETECTED
POC Methadone UR: NOT DETECTED
POC Methamphetamine UR: NOT DETECTED
POC Opiate Ur: NOT DETECTED
POC Oxycodone UR: NOT DETECTED
POC PHENCYCLIDINE UR: NOT DETECTED
POC TRICYCLICS UR: NOT DETECTED

## 2019-10-09 MED ORDER — AMPHETAMINE-DEXTROAMPHETAMINE 10 MG PO TABS: 10.0000 mg | ORAL_TABLET | Freq: Every day | ORAL | 0 refills | Status: DC

## 2019-10-09 MED ORDER — VALACYCLOVIR HCL 1 G PO TABS: 1000.0000 mg | ORAL_TABLET | Freq: Two times a day (BID) | ORAL | 1 refills | Status: DC

## 2019-10-09 NOTE — Progress Notes (Signed)
[facility] [address] [place], [place] [address]  Internal MEDICINE  Office Visit Note  Patient Name: Carolyn Edwards  [id number]  [id number]  Date of Service: 10/10/2019  Chief Complaint  Patient presents with  . Follow-up    cold sores, refill request  . Quality Metric Gaps    HepC, HIV screening, TDAP, pap    HPI  Patient is being seen today for routine follow-up. -Discussed that she needs to be seen for a physical this year, gets PAP at GYN but has not had one this year -Having frequent episodes of flever blisters and having upper respiratory symptoms, thinks it might be seasonal allergies -Was never tested for COVID but felt as though she had it several months ago -Requesting refills for Adderall today. 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 She has since continued to help with concentration. In a 7 day period she will typically take her Adderall 5 of those days. When she does not take her Adderall she says she has a lack of motivation and feels depressed and cries a lot.   Current Medication: Outpatient Encounter Medications as of 10/09/2019  Medication Sig  . amphetamine-dextroamphetamine (ADDERALL) 10 MG tablet Take 1 tablet (10 mg total) by mouth daily.  . butalbital-acetaminophen-caffeine (FIORICET) 50-325-40 MG tablet Take 1-2 tablets by mouth every 6 (six) hours as needed for headache.  . ibuprofen (ADVIL,MOTRIN) 600 MG tablet Take 1 tablet (600 mg total) by mouth every 6 (six) hours as needed.  . Multiple Vitamins-Minerals (HAIR SKIN AND NAILS FORMULA) TABS Take 3 tablets by mouth daily.  Marland Kitchen Ubrogepant (UBRELVY) 50 MG TABS Take 1-2 tablets by mouth as needed.  . valACYclovir (VALTREX) 1000 MG tablet Take 1 tablet (1,000 mg total) by mouth 2 (two) times daily.  . [DISCONTINUED] amphetamine-dextroamphetamine (ADDERALL) 10 MG tablet Take 1 tablet (10 mg total) by mouth daily.  .  [DISCONTINUED] amphetamine-dextroamphetamine (ADDERALL) 10 MG tablet Take 1 tablet (10 mg total) by mouth daily.  . [DISCONTINUED] amphetamine-dextroamphetamine (ADDERALL) 10 MG tablet Take 1 tablet (10 mg total) by mouth 2 (two) times daily.  . [DISCONTINUED] valACYclovir (VALTREX) 1000 MG tablet Take 1 tablet (1,000 mg total) by mouth 2 (two) times daily.  Marland Kitchen amphetamine-dextroamphetamine (ADDERALL) 10 MG tablet Take 1 tablet (10 mg total) by mouth daily with breakfast.  . Aspirin-Acetaminophen-Caffeine (GOODY HEADACHE PO) Take 1 packet by mouth daily as needed (headaches). (Patient not taking: Reported on 10/09/2019)   No facility-administered encounter medications on file as of 10/09/2019.    Surgical History: Past Surgical History:  Procedure Laterality Date  . BREAST ENHANCEMENT SURGERY    . COLPOSCOPY  01/2005   bxs neg  . FOREIGN BODY REMOVAL N/A 03/22/2017   Procedure: FOREIGN BODY REMOVAL;  Surgeon: Vena Austria, MD;  Location: [facility] ORS;  Service: Gynecology;  Laterality: N/A;  . INTRAUTERINE DEVICE (IUD) INSERTION  08/2006   Mirena    Medical History: Past Medical History:  Diagnosis Date  . Dysmetabolic syndrome X 03/23/2011  . Genital warts   . LGSIL on Pap smear of cervix 2006  . Migraine   . Ovarian cyst 11/23/2015  . Thyroid nodule     Family History: Family History  Problem Relation Age of Onset  . Diabetes Mother   . Colon cancer Maternal Grandfather 72    Social History   Socioeconomic History  . Marital status: Married    Spouse name: Not on file  .  Number of children: Not on file  . Years of education: Not on file  . Highest education level: Not on file  Occupational History  . Not on file  Tobacco Use  . Smoking status: Never Smoker  . Smokeless tobacco: Never Used  Vaping Use  . Vaping Use: Never used  Substance and Sexual Activity  . Alcohol use: No  . Drug use: No  . Sexual activity: Not Currently    Birth control/protection: I.U.D.     Comment: pt has IUD surgically removed  Other Topics Concern  . Not on file  Social History Narrative  . Not on file   Social Determinants of Health   Financial Resource Strain:   . Difficulty of Paying Living Expenses: Not on file  Food Insecurity:   . Worried About Programme researcher, broadcasting/film/videounning Out of Food in the Last Year: Not on file  . Ran Out of Food in the Last Year: Not on file  Transportation Needs:   . Lack of Transportation (Medical): Not on file  . Lack of Transportation (Non-Medical): Not on file  Physical Activity:   . Days of Exercise per Week: Not on file  . Minutes of Exercise per Session: Not on file  Stress:   . Feeling of Stress : Not on file  Social Connections:   . Frequency of Communication with Friends and Family: Not on file  . Frequency of Social Gatherings with Friends and Family: Not on file  . Attends Religious Services: Not on file  . Active Member of Clubs or Organizations: Not on file  . Attends BankerClub or Organization Meetings: Not on file  . Marital Status: Not on file  Intimate Partner Violence:   . Fear of Current or Ex-Partner: Not on file  . Emotionally Abused: Not on file  . Physically Abused: Not on file  . Sexually Abused: Not on file      Review of Systems  Constitutional: Negative for chills, diaphoresis and fatigue.  HENT: Positive for congestion, mouth sores and rhinorrhea. Negative for ear pain, postnasal drip, sinus pressure, sinus pain and sore throat.        Fever blister on lip  Eyes: Negative for photophobia, discharge, redness, itching and visual disturbance.  Respiratory: Negative for cough, shortness of breath and wheezing.   Cardiovascular: Negative for chest pain, palpitations and leg swelling.  Gastrointestinal: Negative for abdominal pain, constipation, diarrhea, nausea and vomiting.  Genitourinary: Negative for dysuria and flank pain.  Musculoskeletal: Negative for arthralgias, back pain, gait problem and neck pain.  Skin: Negative for  color change.  Allergic/Immunologic: Negative for environmental allergies and food allergies.  Neurological: Negative for dizziness and headaches.  Hematological: Does not bruise/bleed easily.  Psychiatric/Behavioral: Negative for agitation, behavioral problems (depression) and hallucinations.    Vital Signs: BP 119/88   Pulse 82   Temp 98.8 F (37.1 C)   Resp 16   Ht 5\' 3"  (1.6 m)   Wt 140 lb 12.8 oz (63.9 kg)   SpO2 100%   BMI 24.94 kg/m    Physical Exam Vitals reviewed.  Constitutional:      Appearance: Normal appearance. She is normal weight.  HENT:     Mouth/Throat:     Mouth: Mucous membranes are moist.     Pharynx: Oropharynx is clear.  Cardiovascular:     Rate and Rhythm: Normal rate and regular rhythm.     Pulses: Normal pulses.     Heart sounds: Normal heart sounds.  Pulmonary:  Effort: Pulmonary effort is normal.     Breath sounds: Normal breath sounds.  Abdominal:     General: Abdomen is flat.     Palpations: Abdomen is soft.  Musculoskeletal:        General: Normal range of motion.     Cervical back: Normal range of motion.  Skin:    General: Skin is warm.  Neurological:     General: No focal deficit present.     Mental Status: She is alert and oriented to person, place, and time. Mental status is at baseline.  Psychiatric:        Mood and Affect: Mood normal.        Behavior: Behavior normal.        Thought Content: Thought content normal.        Judgment: Judgment normal.     Assessment/Plan: 1. Chronic sinusitis, unspecified location For the past 2 months she has had congestion and rhinorrhea. She does not feel bad overall, afebrile, no cough or shortness of breath. Will review labs to rule out infection. Encouraged to try allergy medication daily to help with symptom management. Might need allergy testing and CT sinuses   2. Attention and concentration deficit In depth discussion that Adderall is for established diagnosis of ADD/ADHD. Has  not had testing done in the past, we will be working together to wean her off of this medication slowly. Her goal is to decrease her weekly intake by one, for a total of 4 days in a 7 day period before next follow-up visit. Due to the side effects she is experiencing when she does not take Adderall, discussed with her options of treating and managing potential underlying depression. - amphetamine-dextroamphetamine (ADDERALL) 10 MG tablet; Take 1 tablet (10 mg total) by mouth daily.  Dispense: 30 tablet; Refill: 0 - amphetamine-dextroamphetamine (ADDERALL) 10 MG tablet; Take 1 tablet (10 mg total) by mouth daily with breakfast.  Dispense: 30 tablet; Refill: 0 Reviewed risks and possible side effects associated with taking opiates, benzodiazepines and other CNS depressants. Combination of these could cause dizziness and drowsiness. Advised patient not to drive or operate machinery when taking these medications, as patient's and other's life can be at risk and will have consequences. Patient verbalized understanding in this matter. Dependence and abuse for these drugs will be monitored closely. A Controlled substance policy and procedure is on file which allows [facility] to order a urine drug screen test at any visit. Patient understands and agrees with the plan  3. Fever blister Having recurrent breakouts in the past month, requesting refills today. - valACYclovir (VALTREX) 1000 MG tablet; Take 1 tablet (1,000 mg total) by mouth 2 (two) times daily.  Dispense: 20 tablet; Refill: 1  4. Encounter for long-term (current) use of medications - POCT Urine Drug Screen  Annual labs ordered to be discussed at next follow-up visit. - CBC w/Diff/Platelet - Lipid Panel With LDL/HDL Ratio - TSH + free T4 - Comprehensive Metabolic Panel (CMET) - B12 - Vitamin D 1,25 dihydroxy  General Counseling: Carolyn Edwards verbalizes understanding of the findings of todays visit and agrees with plan of treatment. I  have discussed any further diagnostic evaluation that may be needed or ordered today. We also reviewed her medications today. she has been encouraged to call the office with any questions or concerns that should arise related to todays visit.    Orders Placed This Encounter  Procedures  . CBC w/Diff/Platelet  . Lipid Panel With LDL/HDL Ratio  .  TSH + free T4  . Comprehensive Metabolic Panel (CMET)  . B12  . Vitamin D 1,25 dihydroxy  . POCT Urine Drug Screen    Meds ordered this encounter  Medications  . valACYclovir (VALTREX) 1000 MG tablet    Sig: Take 1 tablet (1,000 mg total) by mouth 2 (two) times daily.    Dispense:  20 tablet    Refill:  1  . amphetamine-dextroamphetamine (ADDERALL) 10 MG tablet    Sig: Take 1 tablet (10 mg total) by mouth daily.    Dispense:  30 tablet    Refill:  0    Do not fill before 10/12/2019  . amphetamine-dextroamphetamine (ADDERALL) 10 MG tablet    Sig: Take 1 tablet (10 mg total) by mouth daily with breakfast.    Dispense:  30 tablet    Refill:  0    Do not fill before 11/11/2019    Time spent: 35 Minutes Time spent includes review of chart, medications, test results and follow-up plan with the patient.  This patient was seen by Leeanne Deed AGNP-C in Collaboration with Dr Lyndon Code as a part of collaborative care agreement     Lubertha Basque. Bonnie Overdorf AGNP-C Internal medicine

## 2019-10-10 ENCOUNTER — Encounter: Payer: Self-pay | Admitting: Hospice and Palliative Medicine

## 2019-10-13 ENCOUNTER — Encounter: Payer: Self-pay | Admitting: Hospice and Palliative Medicine

## 2019-10-13 ENCOUNTER — Telehealth: Payer: Self-pay

## 2019-10-13 ENCOUNTER — Other Ambulatory Visit: Payer: Self-pay

## 2019-10-13 DIAGNOSIS — Z1152 Encounter for screening for COVID-19: Secondary | ICD-10-CM

## 2019-10-13 NOTE — Telephone Encounter (Signed)
-----   Message from Theotis Burrow, NP sent at 10/13/2019  8:19 AM EDT ----- Elvina Sidle. I saw her last week and I totally forgot about the COVID antibody testing she asked for...would [facility] still have her blood sample? If not, can you order it for me and call her and tell her I totally forgot and I am sorry.  Thanks :)

## 2019-10-13 NOTE — Progress Notes (Signed)
Reviewed, normal labs, will review at next follow-up.

## 2019-10-13 NOTE — Telephone Encounter (Signed)
Spoke with  Pt that we called [facility] and add on covid antibody if they cannot add on then I call back her labs order already in system

## 2019-10-14 ENCOUNTER — Other Ambulatory Visit: Payer: Self-pay | Admitting: Nurse Practitioner

## 2019-10-14 ENCOUNTER — Other Ambulatory Visit: Payer: Self-pay

## 2019-10-14 ENCOUNTER — Telehealth: Payer: Self-pay

## 2019-10-14 DIAGNOSIS — B001 Herpesviral vesicular dermatitis: Secondary | ICD-10-CM

## 2019-10-14 DIAGNOSIS — R41840 Attention and concentration deficit: Secondary | ICD-10-CM

## 2019-10-14 LAB — SPECIMEN STATUS REPORT

## 2019-10-14 LAB — SAR COV2 SEROLOGY (COVID19)AB(IGG),IA: DiaSorin SARS-CoV-2 Ab, IgG: POSITIVE

## 2019-10-14 MED ORDER — AMPHETAMINE-DEXTROAMPHETAMINE 10 MG PO TABS: 10.0000 mg | ORAL_TABLET | Freq: Every day | ORAL | 0 refills | Status: DC

## 2019-10-14 MED ORDER — VALACYCLOVIR HCL 1 G PO TABS: 1000.0000 mg | ORAL_TABLET | Freq: Two times a day (BID) | ORAL | 1 refills | Status: DC

## 2019-10-14 NOTE — Telephone Encounter (Signed)
Resent her prescriptions for adderall to [facility]

## 2019-10-16 LAB — CBC WITH DIFFERENTIAL/PLATELET
Basophils Absolute: 0 10*3/uL (ref 0.0–0.2)
Basos: 0 %
EOS (ABSOLUTE): 0.1 10*3/uL (ref 0.0–0.4)
Eos: 3 %
Hematocrit: 38.1 % (ref 34.0–46.6)
Hemoglobin: 13.2 g/dL (ref 11.1–15.9)
Immature Grans (Abs): 0 10*3/uL (ref 0.0–0.1)
Immature Granulocytes: 0 %
Lymphocytes Absolute: 0.8 10*3/uL (ref 0.7–3.1)
Lymphs: 17 %
MCH: 32 pg (ref 26.6–33.0)
MCHC: 34.6 g/dL (ref 31.5–35.7)
MCV: 93 fL (ref 79–97)
Monocytes Absolute: 0.4 10*3/uL (ref 0.1–0.9)
Monocytes: 8 %
Neutrophils Absolute: 3.2 10*3/uL (ref 1.4–7.0)
Neutrophils: 72 %
Platelets: 181 10*3/uL (ref 150–450)
RBC: 4.12 x10E6/uL (ref 3.77–5.28)
RDW: 11.7 % (ref 11.7–15.4)
WBC: 4.6 10*3/uL (ref 3.4–10.8)

## 2019-10-16 LAB — LIPID PANEL WITH LDL/HDL RATIO
Cholesterol, Total: 181 mg/dL (ref 100–199)
HDL: 84 mg/dL (ref >39)
LDL Chol Calc (NIH): 76 mg/dL (ref 0–99)
LDL/HDL Ratio: 0.9 ratio (ref 0.0–3.2)
Triglycerides: 123 mg/dL (ref 0–149)
VLDL Cholesterol Cal: 21 mg/dL (ref 5–40)

## 2019-10-16 LAB — VITAMIN D 1,25 DIHYDROXY
Vitamin D 1, 25 (OH)2 Total: 95 pg/mL — ABNORMAL HIGH
Vitamin D2 1, 25 (OH)2: <10 pg/mL
Vitamin D3 1, 25 (OH)2: 95 pg/mL

## 2019-10-16 LAB — COMPREHENSIVE METABOLIC PANEL
ALT: 12 IU/L (ref 0–32)
AST: 16 IU/L (ref 0–40)
Albumin/Globulin Ratio: 2 (ref 1.2–2.2)
Albumin: 4.5 g/dL (ref 3.8–4.8)
Alkaline Phosphatase: 58 IU/L (ref 48–121)
BUN/Creatinine Ratio: 21 (ref 9–23)
BUN: 16 mg/dL (ref 6–20)
Bilirubin Total: 0.2 mg/dL (ref 0.0–1.2)
CO2: 24 mmol/L (ref 20–29)
Calcium: 9.4 mg/dL (ref 8.7–10.2)
Chloride: 104 mmol/L (ref 96–106)
Creatinine, Ser: 0.75 mg/dL (ref 0.57–1.00)
GFR calc Af Amer: 122 mL/min/{1.73_m2} (ref >59)
GFR calc non Af Amer: 106 mL/min/{1.73_m2} (ref >59)
Globulin, Total: 2.3 g/dL (ref 1.5–4.5)
Glucose: 94 mg/dL (ref 65–99)
Potassium: 3.8 mmol/L (ref 3.5–5.2)
Sodium: 140 mmol/L (ref 134–144)
Total Protein: 6.8 g/dL (ref 6.0–8.5)

## 2019-10-16 LAB — TSH+FREE T4
Free T4: 1 ng/dL (ref 0.82–1.77)
TSH: 1.23 u[IU]/mL (ref 0.450–4.500)

## 2019-10-16 LAB — VITAMIN B12: Vitamin B-12: 294 pg/mL (ref 232–1245)

## 2019-11-27 ENCOUNTER — Other Ambulatory Visit (HOSPITAL_COMMUNITY)
Admission: RE | Admit: 2019-11-27 | Discharge: 2019-11-27 | Disposition: A | Discharge status: Home / Self Care | Payer: Medicaid Other | Source: Ambulatory Visit | Attending: Advanced Practice Midwife | Admitting: Advanced Practice Midwife

## 2019-11-27 ENCOUNTER — Other Ambulatory Visit: Payer: Self-pay

## 2019-11-27 ENCOUNTER — Ambulatory Visit (INDEPENDENT_AMBULATORY_CARE_PROVIDER_SITE_OTHER): Payer: Medicaid Other | Admitting: Advanced Practice Midwife

## 2019-11-27 ENCOUNTER — Encounter: Payer: Self-pay | Admitting: Advanced Practice Midwife

## 2019-11-27 VITALS — BP 130/83 | Ht 65.0 in | Wt 138.0 lb

## 2019-11-27 DIAGNOSIS — Z124 Encounter for screening for malignant neoplasm of cervix: Secondary | ICD-10-CM | POA: Diagnosis present

## 2019-11-27 DIAGNOSIS — Z01419 Encounter for gynecological examination (general) (routine) without abnormal findings: Secondary | ICD-10-CM | POA: Insufficient documentation

## 2019-11-27 NOTE — Progress Notes (Signed)
Gynecology Annual Exam   PCP: Lyndon Code, MD  Chief Complaint:  Chief Complaint  Patient presents with  . Gynecologic Exam    annual exam     History of Present Illness: Patient is a 34 y.o. G1P1001 presents for annual exam. The patient has no gyn complaints today.   LMP: Patient's last menstrual period was 11/09/2019 (exact date). Average Interval: regular, 28 days Duration of flow: 3-4 days Heavy Menses: no Clots: no Intermenstrual Bleeding: no Postcoital Bleeding: no Dysmenorrhea: no  The patient is sexually active. She currently uses none for contraception. She denies dyspareunia.  The patient does not perform self breast exams.  There is no notable family history of breast or ovarian cancer in her family.  The patient wears seatbelts: yes.   The patient has regular exercise: she runs 2-3 miles about 4 days per week. She admits healthy diet. She admits poor hydration with water, primary beverage is diet coke. She gets about 7 hours sleep per night.    The patient denies current symptoms of depression.    Review of Systems: Review of Systems  Constitutional: Negative for chills and fever.  HENT: Negative for congestion, ear discharge, ear pain, hearing loss, sinus pain and sore throat.   Eyes: Negative for blurred vision and double vision.  Respiratory: Negative for cough, shortness of breath and wheezing.   Cardiovascular: Negative for chest pain, palpitations and leg swelling.  Gastrointestinal: Negative for abdominal pain, blood in stool, constipation, diarrhea, heartburn, melena, nausea and vomiting.  Genitourinary: Negative for dysuria, flank pain, frequency, hematuria and urgency.  Musculoskeletal: Negative for back pain, joint pain and myalgias.  Skin: Negative for itching and rash.  Neurological: Negative for dizziness, tingling, tremors, sensory change, speech change, focal weakness, seizures, loss of consciousness, weakness and headaches.    Endo/Heme/Allergies: Negative for environmental allergies. Does not bruise/bleed easily.  Psychiatric/Behavioral: Negative for depression, hallucinations, memory loss, substance abuse and suicidal ideas. The patient is not nervous/anxious and does not have insomnia.     Past Medical History:  Patient Active Problem List   Diagnosis Date Noted  . Other fatigue 10/25/2017  . Attention and concentration deficit 10/25/2017    Past Surgical History:  Past Surgical History:  Procedure Laterality Date  . BREAST ENHANCEMENT SURGERY    . COLPOSCOPY  01/2005   bxs neg  . FOREIGN BODY REMOVAL N/A 03/22/2017   Procedure: FOREIGN BODY REMOVAL;  Surgeon: Vena Austria, MD;  Location: [facility] ORS;  Service: Gynecology;  Laterality: N/A;  . INTRAUTERINE DEVICE (IUD) INSERTION  08/2006   Mirena    Gynecologic History:  Patient's last menstrual period was 11/09/2019 (exact date). Contraception: none Last Pap: 4 years ago Results were: no abnormalities   Obstetric History: G1P1001  Family History:  Family History  Problem Relation Age of Onset  . Diabetes Mother   . Colon cancer Maternal Grandfather 49    Social History:  Social History   Socioeconomic History  . Marital status: Married    Spouse name: Not on file  . Number of children: Not on file  . Years of education: Not on file  . Highest education level: Not on file  Occupational History  . Not on file  Tobacco Use  . Smoking status: Never Smoker  . Smokeless tobacco: Never Used  Vaping Use  . Vaping Use: Never used  Substance and Sexual Activity  . Alcohol use: No  . Drug use: No  . Sexual activity: Yes  Birth control/protection: None    Comment: pt has IUD surgically removed  Other Topics Concern  . Not on file  Social History Narrative  . Not on file   Social Determinants of Health   Financial Resource Strain:   . Difficulty of Paying Living Expenses: Not on file  Food Insecurity:   . Worried About  Programme researcher, broadcasting/film/video in the Last Year: Not on file  . Ran Out of Food in the Last Year: Not on file  Transportation Needs:   . Lack of Transportation (Medical): Not on file  . Lack of Transportation (Non-Medical): Not on file  Physical Activity:   . Days of Exercise per Week: Not on file  . Minutes of Exercise per Session: Not on file  Stress:   . Feeling of Stress : Not on file  Social Connections:   . Frequency of Communication with Friends and Family: Not on file  . Frequency of Social Gatherings with Friends and Family: Not on file  . Attends Religious Services: Not on file  . Active Member of Clubs or Organizations: Not on file  . Attends Banker Meetings: Not on file  . Marital Status: Not on file  Intimate Partner Violence:   . Fear of Current or Ex-Partner: Not on file  . Emotionally Abused: Not on file  . Physically Abused: Not on file  . Sexually Abused: Not on file    Allergies:  Allergies  Allergen Reactions  . Imitrex [Sumatriptan] Anaphylaxis    Medications: Prior to Admission medications   Medication Sig Start Date End Date Taking? Authorizing Provider  amphetamine-dextroamphetamine (ADDERALL) 10 MG tablet Take 1 tablet (10 mg total) by mouth daily. 10/14/19  Yes Boscia, Kathlynn Grate, NP  Multiple Vitamins-Minerals (HAIR SKIN AND NAILS FORMULA) TABS Take 3 tablets by mouth daily.   Yes [provider]  valACYclovir (VALTREX) 1000 MG tablet Take 1 tablet (1,000 mg total) by mouth 2 (two) times daily. 10/14/19  Yes Carlean Jews, NP    Physical Exam Vitals: Blood pressure 130/83, height 5\' 5"  (1.651 m), weight 138 lb (62.6 kg), last menstrual period 11/09/2019.  General: NAD HEENT: normocephalic, anicteric Thyroid: no enlargement, no palpable nodules Pulmonary: No increased work of breathing, CTAB Cardiovascular: RRR, distal pulses 2+ Breast: Breast symmetrical, no tenderness, no palpable nodules or masses, no skin or nipple retraction  present, no nipple discharge.  No axillary or supraclavicular lymphadenopathy. Abdomen: NABS, soft, non-tender, non-distended.  Umbilicus without lesions.  No hepatomegaly, splenomegaly or masses palpable. No evidence of hernia  Genitourinary:  External: Normal external female genitalia.  Normal urethral meatus, normal Bartholin's and Skene's glands.    Vagina: Normal vaginal mucosa, no evidence of prolapse.    Cervix: Grossly normal in appearance, no bleeding, nabothian cysts present, no CMT  Uterus: deferred for no concerns   Adnexa: deferred for no concerns  Rectal: deferred  Lymphatic: no evidence of inguinal lymphadenopathy Extremities: no edema, erythema, or tenderness Neurologic: Grossly intact Psychiatric: mood appropriate, affect full    Assessment: 34 y.o. G1P1001 routine annual exam  Plan: Problem List Items Addressed This Visit    None    Visit Diagnoses    Well woman exam with routine gynecological exam    -  Primary   Relevant Orders   Cytology - PAP   Cervical cancer screening       Relevant Orders   Cytology - PAP      1) STI screening  was offered and  declined  2)  ASCCP guidelines and rationale discussed.  Patient opts for every 5 years screening interval  3) Contraception - the patient is currently using  none.  She is attempting to conceive in the near future  4) Routine healthcare maintenance including cholesterol, diabetes screening discussed managed by PCP  5) Return to clinic PRN and for annual exam   Tresea Mall, CNM [facility] 11/27/2019, 2:16 PM

## 2019-12-03 LAB — CYTOLOGY - PAP
Comment: NEGATIVE
Diagnosis: UNDETERMINED — AB
High risk HPV: NEGATIVE

## 2019-12-09 ENCOUNTER — Telehealth: Payer: Self-pay

## 2019-12-09 ENCOUNTER — Encounter: Payer: Medicaid Other | Admitting: Hospice and Palliative Medicine

## 2019-12-09 NOTE — Telephone Encounter (Signed)
Called patient to rs her missed appt on 12-09-19 she will call back to rs.

## 2020-04-14 ENCOUNTER — Other Ambulatory Visit: Payer: Self-pay

## 2020-04-14 ENCOUNTER — Encounter: Payer: Self-pay | Admitting: Hospice and Palliative Medicine

## 2020-04-14 ENCOUNTER — Ambulatory Visit: Payer: Medicaid Other | Admitting: Hospice and Palliative Medicine

## 2020-04-14 VITALS — BP 126/88 | HR 78 | Temp 97.7°F | Resp 16 | Ht 65.0 in | Wt 143.4 lb

## 2020-04-14 DIAGNOSIS — R41840 Attention and concentration deficit: Secondary | ICD-10-CM

## 2020-04-14 DIAGNOSIS — B001 Herpesviral vesicular dermatitis: Secondary | ICD-10-CM

## 2020-04-14 DIAGNOSIS — R140 Abdominal distension (gaseous): Secondary | ICD-10-CM | POA: Diagnosis not present

## 2020-04-14 MED ORDER — VALACYCLOVIR HCL 1 G PO TABS: 1000.0000 mg | ORAL_TABLET | Freq: Two times a day (BID) | ORAL | 1 refills | Status: DC

## 2020-04-14 NOTE — Progress Notes (Signed)
[facility] [address] [place], [place] [address]  Internal MEDICINE  Office Visit Note  Patient Name: Carolyn Edwards  [id number]  [id number]  Date of Service: 04/16/2020  Chief Complaint  Patient presents with  . Acute Visit    Stomach discomfort swelling lower abd, noticed about a month ago, refill request     HPI Pt is here for a sick visit. C/o intermittent abdominal bloating and discomfort First noticed bloating about a month ago--happens intermittently, seemed to be related to certain foods she was eating Has been one week gluten free and has noticed a significant difference in bloating and discomfort Currently without insurance and since there has been improvement in her symptoms with lifestyle changes she would like to hold off on additional work-up at this time Has mild issues with constipation but this is not a new symptom for her and since being gluten free has noticed improvement in her constipation  Requesting refills of valtrex for oral blisters--has breakouts a few times per year  Has weaned herself Adderall--takes one tablet maybe once per week   Current Medication:  Outpatient Encounter Medications as of 04/14/2020  Medication Sig  . Multiple Vitamins-Minerals (HAIR SKIN AND NAILS FORMULA) TABS Take 3 tablets by mouth daily.  . [DISCONTINUED] valACYclovir (VALTREX) 1000 MG tablet Take 1 tablet (1,000 mg total) by mouth 2 (two) times daily.  Marland Kitchen amphetamine-dextroamphetamine (ADDERALL) 10 MG tablet Take 1 tablet (10 mg total) by mouth daily. (Patient not taking: Reported on 04/14/2020)  . valACYclovir (VALTREX) 1000 MG tablet Take 1 tablet (1,000 mg total) by mouth 2 (two) times daily.   No facility-administered encounter medications on file as of 04/14/2020.      Medical History: Past Medical History:  Diagnosis Date  . Dysmetabolic syndrome X 03/23/2011  . Genital warts   . LGSIL on Pap smear of cervix 2006  . Migraine   . Ovarian  cyst 11/23/2015  . Thyroid nodule      Vital Signs: BP 126/88   Pulse 78   Temp 97.7 F (36.5 C)   Resp 16   Ht 5\' 5"  (1.651 m)   Wt 143 lb 6.4 oz (65 kg)   SpO2 99%   BMI 23.86 kg/m    Review of Systems  Constitutional: Negative for chills, diaphoresis and fatigue.  HENT: Negative for ear pain, postnasal drip and sinus pressure.   Eyes: Negative for photophobia, discharge, redness, itching and visual disturbance.  Respiratory: Negative for cough, shortness of breath and wheezing.   Cardiovascular: Negative for chest pain, palpitations and leg swelling.  Gastrointestinal: Positive for abdominal distention and abdominal pain. Negative for constipation, diarrhea, nausea and vomiting.  Genitourinary: Negative for dysuria and flank pain.  Musculoskeletal: Negative for arthralgias, back pain, gait problem and neck pain.  Skin: Negative for color change.       Cold sores  Allergic/Immunologic: Negative for environmental allergies and food allergies.  Neurological: Negative for dizziness and headaches.  Hematological: Does not bruise/bleed easily.  Psychiatric/Behavioral: Negative for agitation, behavioral problems (depression) and hallucinations.    Physical Exam Vitals reviewed.  Constitutional:      Appearance: Normal appearance. She is normal weight.  Cardiovascular:     Rate and Rhythm: Normal rate and regular rhythm.     Pulses: Normal pulses.     Heart sounds: Normal heart sounds.  Pulmonary:     Effort: Pulmonary effort is normal.     Breath sounds: Normal breath sounds.  Abdominal:  General: Abdomen is flat.     Palpations: Abdomen is soft.  Musculoskeletal:        General: Normal range of motion.     Cervical back: Normal range of motion.  Skin:    General: Skin is warm.  Neurological:     General: No focal deficit present.     Mental Status: She is alert and oriented to person, place, and time. Mental status is at baseline.  Psychiatric:        Mood  and Affect: Mood normal.        Behavior: Behavior normal.        Thought Content: Thought content normal.        Judgment: Judgment normal.    Assessment/Plan: 1. Abdominal bloating Encouraged to continue with gluten free diet, if symptoms continue or worsen will call back for potential additional imaging  2. Fever blister Well controlled on Valtrex, requesting refills - valACYclovir (VALTREX) 1000 MG tablet; Take 1 tablet (1,000 mg total) by mouth 2 (two) times daily.  Dispense: 60 tablet; Refill: 1  3. Attention and concentration deficit Has weaned herself off from Adderall, takes rarely as needed, taking one tablet once weekly or every other week  General Counseling: Carolyn Edwards verbalizes understanding of the findings of todays visit and agrees with plan of treatment. I have discussed any further diagnostic evaluation that may be needed or ordered today. We also reviewed her medications today. she has been encouraged to call the office with any questions or concerns that should arise related to todays visit.  Meds ordered this encounter  Medications  . valACYclovir (VALTREX) 1000 MG tablet    Sig: Take 1 tablet (1,000 mg total) by mouth 2 (two) times daily.    Dispense:  60 tablet    Refill:  1    Time spent: 25 Minutes  This patient was seen by Leeanne Deed AGNP-C in Collaboration with Dr Lyndon Code as a part of collaborative care agreement.  Lubertha Basque [facility] Internal Medicine

## 2020-04-16 ENCOUNTER — Encounter: Payer: Self-pay | Admitting: Hospice and Palliative Medicine

## 2020-05-24 ENCOUNTER — Ambulatory Visit: Payer: Medicaid Other | Admitting: Hospice and Palliative Medicine

## 2020-05-24 ENCOUNTER — Other Ambulatory Visit: Payer: Self-pay

## 2020-05-24 ENCOUNTER — Encounter: Payer: Self-pay | Admitting: Hospice and Palliative Medicine

## 2020-05-24 VITALS — BP 114/86 | HR 70 | Temp 97.7°F | Resp 16 | Ht 65.0 in | Wt 137.8 lb

## 2020-05-24 DIAGNOSIS — R140 Abdominal distension (gaseous): Secondary | ICD-10-CM

## 2020-05-24 DIAGNOSIS — R41840 Attention and concentration deficit: Secondary | ICD-10-CM | POA: Diagnosis not present

## 2020-05-24 DIAGNOSIS — Z79899 Other long term (current) drug therapy: Secondary | ICD-10-CM | POA: Diagnosis not present

## 2020-05-24 LAB — POCT URINE DRUG SCREEN
Methylenedioxyamphetamine: NOT DETECTED
POC Amphetamine UR: POSITIVE — AB
POC BENZODIAZEPINES UR: NOT DETECTED
POC Barbiturate UR: NOT DETECTED
POC Cocaine UR: NOT DETECTED
POC Ecstasy UR: NOT DETECTED
POC Marijuana UR: NOT DETECTED
POC Methadone UR: NOT DETECTED
POC Methamphetamine UR: NOT DETECTED
POC Opiate Ur: NOT DETECTED
POC Oxycodone UR: NOT DETECTED
POC PHENCYCLIDINE UR: NOT DETECTED
POC TRICYCLICS UR: NOT DETECTED

## 2020-05-24 MED ORDER — AMPHETAMINE-DEXTROAMPHETAMINE 10 MG PO TABS: 10.0000 mg | ORAL_TABLET | Freq: Two times a day (BID) | ORAL | 0 refills | Status: DC

## 2020-05-24 MED ORDER — AMPHETAMINE-DEXTROAMPHETAMINE 10 MG PO TABS: 10.0000 mg | ORAL_TABLET | Freq: Every day | ORAL | 0 refills | Status: DC

## 2020-05-24 NOTE — Progress Notes (Signed)
[facility] [address] [place], [place] [address]  Internal MEDICINE  Office Visit Note  Patient Name: Carolyn Edwards  [id number]  [id number]  Date of Service: 05/24/2020  Chief Complaint  Patient presents with  . Follow-up    Refills     HPI Patient is here for routine follow-up Has been without gluten for 2 months now and is feeling much better, bloating has improved and overall feeling better Requesting refills of Adderall, takes as needed only on long work days to help with focus and concentration, on some days will cut Adderall tablet in half and symptoms will be well controlled on half dosing Taking more frequently than once weekly as she started noticing she was struggling with focus and concentration more at work Denies negative side effects from medication, chest pain, palpitations, headaches or insomnia   Current Medication: Outpatient Encounter Medications as of 05/24/2020  Medication Sig  . amphetamine-dextroamphetamine (ADDERALL) 10 MG tablet Take 1 tablet (10 mg total) by mouth 2 (two) times daily.  . Multiple Vitamins-Minerals (HAIR SKIN AND NAILS FORMULA) TABS Take 3 tablets by mouth daily.  . valACYclovir (VALTREX) 1000 MG tablet Take 1 tablet (1,000 mg total) by mouth 2 (two) times daily.  . [DISCONTINUED] amphetamine-dextroamphetamine (ADDERALL) 10 MG tablet Take 1 tablet (10 mg total) by mouth daily.  Marland Kitchen amphetamine-dextroamphetamine (ADDERALL) 10 MG tablet Take 1 tablet (10 mg total) by mouth daily.   No facility-administered encounter medications on file as of 05/24/2020.    Surgical History: Past Surgical History:  Procedure Laterality Date  . BREAST ENHANCEMENT SURGERY    . COLPOSCOPY  01/2005   bxs neg  . FOREIGN BODY REMOVAL N/A 03/22/2017   Procedure: FOREIGN BODY REMOVAL;  Surgeon: Vena Austria, MD;  Location: [facility] ORS;  Service: Gynecology;  Laterality: N/A;  . INTRAUTERINE DEVICE (IUD) INSERTION  08/2006   Mirena     Medical History: Past Medical History:  Diagnosis Date  . ADHD   . Dysmetabolic syndrome X 03/23/2011  . Genital warts   . LGSIL on Pap smear of cervix 2006  . Migraine   . Ovarian cyst 11/23/2015  . Thyroid nodule     Family History: Family History  Problem Relation Age of Onset  . Diabetes Mother   . Colon cancer Maternal Grandfather 66    Social History   Socioeconomic History  . Marital status: Married    Spouse name: Not on file  . Number of children: Not on file  . Years of education: Not on file  . Highest education level: Not on file  Occupational History  . Not on file  Tobacco Use  . Smoking status: Never Smoker  . Smokeless tobacco: Never Used  Vaping Use  . Vaping Use: Never used  Substance and Sexual Activity  . Alcohol use: No  . Drug use: No  . Sexual activity: Yes    Birth control/protection: None    Comment: pt has IUD surgically removed  Other Topics Concern  . Not on file  Social History Narrative  . Not on file   Social Determinants of Health   Financial Resource Strain: Not on file  Food Insecurity: Not on file  Transportation Needs: Not on file  Physical Activity: Not on file  Stress: Not on file  Social Connections: Not on file  Intimate Partner Violence: Not on file   Review of Systems  Constitutional: Negative for chills, diaphoresis and fatigue.  HENT: Negative for ear pain, postnasal drip  and sinus pressure.   Eyes: Negative for photophobia, discharge, redness, itching and visual disturbance.  Respiratory: Negative for cough, shortness of breath and wheezing.   Cardiovascular: Negative for chest pain, palpitations and leg swelling.  Gastrointestinal: Negative for abdominal pain, constipation, diarrhea, nausea and vomiting.  Genitourinary: Negative for dysuria and flank pain.  Musculoskeletal: Negative for arthralgias, back pain, gait problem and neck pain.  Skin: Negative for color change.  Allergic/Immunologic:  Negative for environmental allergies and food allergies.  Neurological: Negative for dizziness and headaches.  Hematological: Does not bruise/bleed easily.  Psychiatric/Behavioral: Negative for agitation, behavioral problems (depression) and hallucinations.    Vital Signs: BP 114/86   Pulse 70   Temp 97.7 F (36.5 C)   Resp 16   Ht 5\' 5"  (1.651 m)   Wt 137 lb 12.8 oz (62.5 kg)   SpO2 99%   BMI 22.93 kg/m    Physical Exam Vitals reviewed.  Constitutional:      Appearance: Normal appearance. She is normal weight.  Cardiovascular:     Rate and Rhythm: Normal rate and regular rhythm.     Pulses: Normal pulses.     Heart sounds: Normal heart sounds.  Pulmonary:     Effort: Pulmonary effort is normal.     Breath sounds: Normal breath sounds.  Abdominal:     General: Abdomen is flat.     Palpations: Abdomen is soft.  Musculoskeletal:        General: Normal range of motion.     Cervical back: Normal range of motion.  Skin:    General: Skin is warm.  Neurological:     General: No focal deficit present.     Mental Status: She is alert and oriented to person, place, and time. Mental status is at baseline.  Psychiatric:        Mood and Affect: Mood normal.        Behavior: Behavior normal.        Thought Content: Thought content normal.        Judgment: Judgment normal.    Assessment/Plan: 1. Abdominal bloating Abdominal symptoms have significantly improved since she has removed gluten from her diet--has been gluten free for about 2 months Has also noticed an improvement in her allergy symptoms since removing dietary gluten  2. Attention and concentration deficit Remains well controlled on current dosing, encouraged to continue taking as needed Briefly discussed possible underlying anxiety contributing to her symptoms, she is hesitant to initiate therapy due to negative side effects and having to take a daily medication--will continue with discussions at next visits -  amphetamine-dextroamphetamine (ADDERALL) 10 MG tablet; Take 1 tablet (10 mg total) by mouth daily.  Dispense: 30 tablet; Refill: 0 - amphetamine-dextroamphetamine (ADDERALL) 10 MG tablet; Take 1 tablet (10 mg total) by mouth 2 (two) times daily.  Dispense: 60 tablet; Refill: 0  3. Encounter for long-term (current) use of medications - POCT Urine Drug Screen  General Counseling: Tiziana verbalizes understanding of the findings of todays visit and agrees with plan of treatment. I have discussed any further diagnostic evaluation that may be needed or ordered today. We also reviewed her medications today. she has been encouraged to call the office with any questions or concerns that should arise related to todays visit.    Orders Placed This Encounter  Procedures  . POCT Urine Drug Screen    Meds ordered this encounter  Medications  . amphetamine-dextroamphetamine (ADDERALL) 10 MG tablet    Sig: Take 1 tablet (  10 mg total) by mouth daily.    Dispense:  30 tablet    Refill:  0  . amphetamine-dextroamphetamine (ADDERALL) 10 MG tablet    Sig: Take 1 tablet (10 mg total) by mouth 2 (two) times daily.    Dispense:  60 tablet    Refill:  0    Do not fill prior to May 25th    Time spent: 30 Minutes Time spent includes review of chart, medications, test results and follow-up plan with the patient.  This patient was seen by Leeanne Deed AGNP-C in Collaboration with Dr Lyndon Code as a part of collaborative care agreement     Lubertha Basque. Pebbles Zeiders AGNP-C Internal medicine

## 2020-05-25 ENCOUNTER — Ambulatory Visit: Payer: Medicaid Other | Admitting: Adult Health

## 2020-06-14 ENCOUNTER — Encounter: Payer: Self-pay | Admitting: Physician Assistant

## 2020-06-15 ENCOUNTER — Other Ambulatory Visit: Payer: Self-pay

## 2020-06-15 MED ORDER — AZITHROMYCIN 250 MG PO TABS: ORAL_TABLET | ORAL | 0 refills | Status: DC

## 2020-06-15 MED ORDER — FLUTICASONE PROPIONATE 50 MCG/ACT NA SUSP: 2.0000 | Freq: Every day | NASAL | 6 refills | Status: DC

## 2020-06-17 ENCOUNTER — Ambulatory Visit: Payer: Medicaid Other | Admitting: Internal Medicine

## 2020-06-17 VITALS — Resp 16 | Ht 65.0 in | Wt 135.0 lb

## 2020-06-17 DIAGNOSIS — J40 Bronchitis, not specified as acute or chronic: Secondary | ICD-10-CM

## 2020-06-17 DIAGNOSIS — U071 COVID-19: Secondary | ICD-10-CM

## 2020-06-17 MED ORDER — PREDNISONE 10 MG PO TABS: ORAL_TABLET | ORAL | 0 refills | Status: DC

## 2020-06-17 NOTE — Telephone Encounter (Signed)
Can set her up

## 2020-06-17 NOTE — Progress Notes (Signed)
Carolyn Edwards [address] [place], [place] [address]  Internal MEDICINE  Telephone Visit  Patient Name: Carolyn Edwards  [id number]  [id number]  Date of Service: 06/20/2020  I connected with the patient at 1010 by telephone and verified the patients identity using two identifiers.   I discussed the limitations, risks, security and privacy concerns of performing an evaluation and management service by telephone and the availability of in person appointments. I also discussed with the patient that there may be a patient responsible charge related to the service.  The patient expressed understanding and agrees to proceed.    Chief Complaint  Patient presents with  . Telephone Assessment    [phone]  . Telephone Screen    Positive covid  headache sinus issues, pt has taken  zpak and Flonase and pt still feels the same  . Sore Throat  . Cough    HPI Patient is connected for acute and sick visit she was tested positive for COVID and was started on Z-Pak and Flonase.  She continues to have sore throat and cough chest feels congested and has been tight denies any fever chills    Current Medication: Outpatient Encounter Medications as of 06/17/2020  Medication Sig  . amphetamine-dextroamphetamine (ADDERALL) 10 MG tablet Take 1 tablet (10 mg total) by mouth daily.  Marland Kitchen amphetamine-dextroamphetamine (ADDERALL) 10 MG tablet Take 1 tablet (10 mg total) by mouth 2 (two) times daily.  Marland Kitchen azithromycin (ZITHROMAX) 250 MG tablet Take as directed for 5 days  . fluticasone (FLONASE) 50 MCG/ACT nasal spray Place 2 sprays into both nostrils daily.  . Multiple Vitamins-Minerals (HAIR SKIN AND NAILS FORMULA) TABS Take 3 tablets by mouth daily.  . predniSONE (DELTASONE) 10 MG tablet Take 3 tabs today , then take one tab 2 x a day for 3 days and then take one tab a day for 3 days  . valACYclovir (VALTREX) 1000 MG tablet Take 1 tablet (1,000 mg total) by mouth 2 (two) times daily.   No  facility-administered encounter medications on file as of 06/17/2020.    Surgical History: Past Surgical History:  Procedure Laterality Date  . BREAST ENHANCEMENT SURGERY    . COLPOSCOPY  01/2005   bxs neg  . FOREIGN BODY REMOVAL N/A 03/22/2017   Procedure: FOREIGN BODY REMOVAL;  Surgeon: Vena Austria, MD;  Location: [facility] ORS;  Service: Gynecology;  Laterality: N/A;  . INTRAUTERINE DEVICE (IUD) INSERTION  08/2006   Mirena    Medical History: Past Medical History:  Diagnosis Date  . ADHD   . Dysmetabolic syndrome X 03/23/2011  . Genital warts   . LGSIL on Pap smear of cervix 2006  . Migraine   . Ovarian cyst 11/23/2015  . Thyroid nodule     Family History: Family History  Problem Relation Age of Onset  . Diabetes Mother   . Colon cancer Maternal Grandfather 29    Social History   Socioeconomic History  . Marital status: Married    Spouse name: Not on file  . Number of Edwards: Not on file  . Years of education: Not on file  . Highest education level: Not on file  Occupational History  . Not on file  Tobacco Use  . Smoking status: Never Smoker  . Smokeless tobacco: Never Used  Vaping Use  . Vaping Use: Never used  Substance and Sexual Activity  . Alcohol use: No  . Drug use: No  . Sexual activity: Yes    Birth control/protection: None  Comment: pt has IUD surgically removed  Other Topics Concern  . Not on file  Social History Narrative  . Not on file   Social Determinants of Health   Financial Resource Strain: Not on file  Food Insecurity: Not on file  Transportation Needs: Not on file  Physical Activity: Not on file  Stress: Not on file  Social Connections: Not on file  Intimate Partner Violence: Not on file      Review of Systems  Constitutional: Negative for fatigue and fever.  HENT: Negative for congestion, mouth sores and postnasal drip.   Respiratory: Positive for cough.   Cardiovascular: Negative for chest pain.  Genitourinary:  Negative for flank pain.  Psychiatric/Behavioral: Negative.     Vital Signs: Resp 16   Ht 5\' 5"  (1.651 m)   Wt 135 lb (61.2 kg)   BMI 22.47 kg/m    Observation/Objective: Pt sounds congested in her chest upon cough    Assessment/Plan:  1. Bronchitis due to COVID-19 virus Patient is instructed to finish his Z-Pak and continue to use Flonase we will add low-dose prednisone as prescribed today.  Patient was also instructed to self isolate for the next 5 days. Rest and drink plenty of fluids and stay hydrated take Tylenol for any fever or chills - predniSONE (DELTASONE) 10 MG tablet; Take 3 tabs today , then take one tab 2 x a day for 3 days and then take one tab a day for 3 days  Dispense: 12 tablet; Refill: 0 General Counseling: [name] verbalizes understanding of the findings of today's phone visit and agrees with plan of treatment. I have discussed any further diagnostic evaluation that may be needed or ordered today. We also reviewed her medications today. she has been encouraged to call the office with any questions or concerns that should arise related to todays visit.   Meds ordered this encounter  Medications  . predniSONE (DELTASONE) 10 MG tablet    Sig: Take 3 tabs today , then take one tab 2 x a day for 3 days and then take one tab a day for 3 days    Dispense:  12 tablet    Refill:  0    Time spent:10 Minutes    Dr Morrie Sheldon Internal medicine

## 2020-09-07 ENCOUNTER — Other Ambulatory Visit: Payer: Self-pay | Admitting: Internal Medicine

## 2020-09-07 ENCOUNTER — Encounter: Payer: Self-pay | Admitting: Physician Assistant

## 2020-09-07 DIAGNOSIS — L989 Disorder of the skin and subcutaneous tissue, unspecified: Secondary | ICD-10-CM

## 2020-09-09 ENCOUNTER — Telehealth: Payer: Self-pay

## 2020-09-09 NOTE — Telephone Encounter (Signed)
Faxed Dermatology referral to [facility] per patient's request at [phone].

## 2020-09-23 ENCOUNTER — Other Ambulatory Visit: Payer: Self-pay

## 2020-09-23 ENCOUNTER — Ambulatory Visit: Payer: Medicaid Other | Admitting: Physician Assistant

## 2020-09-23 ENCOUNTER — Encounter: Payer: Self-pay | Admitting: Physician Assistant

## 2020-09-23 DIAGNOSIS — B001 Herpesviral vesicular dermatitis: Secondary | ICD-10-CM

## 2020-09-23 DIAGNOSIS — L989 Disorder of the skin and subcutaneous tissue, unspecified: Secondary | ICD-10-CM | POA: Diagnosis not present

## 2020-09-23 DIAGNOSIS — Z79899 Other long term (current) drug therapy: Secondary | ICD-10-CM

## 2020-09-23 DIAGNOSIS — R41840 Attention and concentration deficit: Secondary | ICD-10-CM

## 2020-09-23 LAB — POCT URINE DRUG SCREEN
POC Amphetamine UR: POSITIVE — AB
POC BENZODIAZEPINES UR: NOT DETECTED
POC Barbiturate UR: NOT DETECTED
POC Cocaine UR: NOT DETECTED
POC Ecstasy UR: NOT DETECTED
POC Marijuana UR: NOT DETECTED
POC Methadone UR: NOT DETECTED
POC Methamphetamine UR: NOT DETECTED
POC Opiate Ur: NOT DETECTED
POC Oxycodone UR: NOT DETECTED
POC PHENCYCLIDINE UR: NOT DETECTED
POC TRICYCLICS UR: NOT DETECTED

## 2020-09-23 MED ORDER — VALACYCLOVIR HCL 1 G PO TABS: 1000.0000 mg | ORAL_TABLET | Freq: Two times a day (BID) | ORAL | 1 refills | Status: DC

## 2020-09-23 MED ORDER — AMPHETAMINE-DEXTROAMPHETAMINE 10 MG PO TABS: 10.0000 mg | ORAL_TABLET | Freq: Every day | ORAL | 0 refills | Status: DC | PRN

## 2020-09-23 NOTE — Progress Notes (Addendum)
[facility] [address] [place], [place] [address]  Internal MEDICINE  Office Visit Note  Patient Name: Carolyn Edwards  [id number]  [id number]  Date of Service: 09/25/2020  Chief Complaint  Patient presents with   Follow-up   ADHD   Gastroesophageal Reflux    HPI Pt is here for routine follw up for med refill and has no complaints today -Takes adderall only as needed and not daily. Depends on how she is feeling that day -Sleeps well and denies any palpitations -Eats well  -Requests refills today for adderall as well as valtrex to have prn for fever blisters -Patient previously mentioned concern over facial lesion and requests dermatology referral for full skin check.  Current Medication: Outpatient Encounter Medications as of 09/23/2020  Medication Sig   amphetamine-dextroamphetamine (ADDERALL) 10 MG tablet Take 1 tablet (10 mg total) by mouth daily.   fluticasone (FLONASE) 50 MCG/ACT nasal spray Place 2 sprays into both nostrils daily.   Multiple Vitamins-Minerals (HAIR SKIN AND NAILS FORMULA) TABS Take 3 tablets by mouth daily.   [DISCONTINUED] amphetamine-dextroamphetamine (ADDERALL) 10 MG tablet Take 1 tablet (10 mg total) by mouth 2 (two) times daily.   [DISCONTINUED] azithromycin (ZITHROMAX) 250 MG tablet Take as directed for 5 days   [DISCONTINUED] predniSONE (DELTASONE) 10 MG tablet Take 3 tabs today , then take one tab 2 x a day for 3 days and then take one tab a day for 3 days   [DISCONTINUED] valACYclovir (VALTREX) 1000 MG tablet Take 1 tablet (1,000 mg total) by mouth 2 (two) times daily.   amphetamine-dextroamphetamine (ADDERALL) 10 MG tablet Take 1 tablet (10 mg total) by mouth daily as needed.   valACYclovir (VALTREX) 1000 MG tablet Take 1 tablet (1,000 mg total) by mouth 2 (two) times daily.   No facility-administered encounter medications on file as of 09/23/2020.    Surgical History: Past Surgical History:  Procedure Laterality Date    BREAST ENHANCEMENT SURGERY     COLPOSCOPY  01/2005   bxs neg   FOREIGN BODY REMOVAL N/A 03/22/2017   Procedure: FOREIGN BODY REMOVAL;  Surgeon: Vena Austria, MD;  Location: [facility] ORS;  Service: Gynecology;  Laterality: N/A;   INTRAUTERINE DEVICE (IUD) INSERTION  08/2006   Mirena    Medical History: Past Medical History:  Diagnosis Date   ADHD    Dysmetabolic syndrome X 03/23/2011   Genital warts    LGSIL on Pap smear of cervix 2006   Migraine    Ovarian cyst 11/23/2015   Thyroid nodule     Family History: Family History  Problem Relation Age of Onset   Diabetes Mother    Colon cancer Maternal Grandfather 56    Social History   Socioeconomic History   Marital status: Married    Spouse name: Not on file   Number of children: Not on file   Years of education: Not on file   Highest education level: Not on file  Occupational History   Not on file  Tobacco Use   Smoking status: Never   Smokeless tobacco: Never  Vaping Use   Vaping Use: Never used  Substance and Sexual Activity   Alcohol use: No   Drug use: No   Sexual activity: Yes    Birth control/protection: None    Comment: pt has IUD surgically removed  Other Topics Concern   Not on file  Social History Narrative   Not on file   Social Determinants of Health   Financial Resource Strain:  Not on file  Food Insecurity: Not on file  Transportation Needs: Not on file  Physical Activity: Not on file  Stress: Not on file  Social Connections: Not on file  Intimate Partner Violence: Not on file      Review of Systems  Constitutional:  Negative for chills, diaphoresis and fatigue.  HENT:  Negative for ear pain, postnasal drip and sinus pressure.   Eyes:  Negative for photophobia, discharge, redness, itching and visual disturbance.  Respiratory:  Negative for cough, shortness of breath and wheezing.   Cardiovascular:  Negative for chest pain, palpitations and leg swelling.  Gastrointestinal:  Negative  for abdominal pain, constipation, diarrhea, nausea and vomiting.  Genitourinary:  Negative for dysuria and flank pain.  Musculoskeletal:  Negative for arthralgias, back pain, gait problem and neck pain.  Skin:  Negative for color change.  Allergic/Immunologic: Negative for environmental allergies and food allergies.  Neurological:  Negative for dizziness and headaches.  Hematological:  Does not bruise/bleed easily.  Psychiatric/Behavioral:  Negative for agitation, behavioral problems (depression) and hallucinations.    Vital Signs: BP 110/80   Pulse 83   Temp 97.8 F (36.6 C)   Resp 16   Ht 5\' 5"  (1.651 m)   Wt 138 lb (62.6 kg)   SpO2 99%   BMI 22.96 kg/m    Physical Exam Vitals and nursing note reviewed.  Constitutional:      General: She is not in acute distress.    Appearance: She is well-developed and normal weight. She is not diaphoretic.  HENT:     Head: Normocephalic and atraumatic.     Mouth/Throat:     Pharynx: No oropharyngeal exudate.  Eyes:     Pupils: Pupils are equal, round, and reactive to light.  Neck:     Thyroid: No thyromegaly.     Vascular: No JVD.     Trachea: No tracheal deviation.  Cardiovascular:     Rate and Rhythm: Normal rate and regular rhythm.     Heart sounds: Normal heart sounds. No murmur heard.   No friction rub. No gallop.  Pulmonary:     Effort: Pulmonary effort is normal. No respiratory distress.     Breath sounds: No wheezing or rales.  Chest:     Chest wall: No tenderness.  Abdominal:     General: Bowel sounds are normal.     Palpations: Abdomen is soft.  Musculoskeletal:        General: Normal range of motion.     Cervical back: Normal range of motion and neck supple.  Lymphadenopathy:     Cervical: No cervical adenopathy.  Skin:    General: Skin is warm and dry.  Neurological:     Mental Status: She is alert and oriented to person, place, and time.     Cranial Nerves: No cranial nerve deficit.  Psychiatric:         Behavior: Behavior normal.        Thought Content: Thought content normal.        Judgment: Judgment normal.       Assessment/Plan: 1. Attention and concentration deficit May take adderall as needed - amphetamine-dextroamphetamine (ADDERALL) 10 MG tablet; Take 1 tablet (10 mg total) by mouth daily as needed.  Dispense: 60 tablet; Refill: 0  2. Fever blister May take as needed for breakout - valACYclovir (VALTREX) 1000 MG tablet; Take 1 tablet (1,000 mg total) by mouth 2 (two) times daily.  Dispense: 60 tablet; Refill: 1  3. Encounter  for long-term (current) use of medications - POCT Urine Drug Screen  4. Lesion of face -Derm referral placed  General Counseling: rupal childress understanding of the findings of todays visit and agrees with plan of treatment. I have discussed any further diagnostic evaluation that may be needed or ordered today. We also reviewed her medications today. she has been encouraged to call the office with any questions or concerns that should arise related to todays visit.    Orders Placed This Encounter  Procedures   POCT Urine Drug Screen    Meds ordered this encounter  Medications   valACYclovir (VALTREX) 1000 MG tablet    Sig: Take 1 tablet (1,000 mg total) by mouth 2 (two) times daily.    Dispense:  60 tablet    Refill:  1   amphetamine-dextroamphetamine (ADDERALL) 10 MG tablet    Sig: Take 1 tablet (10 mg total) by mouth daily as needed.    Dispense:  60 tablet    Refill:  0    This patient was seen by Lynn Ito, [name] in collaboration with Dr. Beverely Risen as a part of collaborative care agreement.   Total time spent:30 Minutes Time spent includes review of chart, medications, test results, and follow up plan with the patient.      Dr Lyndon Code Internal medicine

## 2020-09-29 ENCOUNTER — Telehealth: Payer: Self-pay

## 2020-09-29 NOTE — Telephone Encounter (Signed)
Left vm for [facility] to return my call regarding Medicaid form-Toni

## 2020-10-06 ENCOUNTER — Telehealth: Payer: Self-pay

## 2020-10-06 NOTE — Telephone Encounter (Signed)
Dermatology referral manually faxed to [facility]-Toni

## 2020-10-10 ENCOUNTER — Encounter: Payer: Self-pay | Admitting: Physician Assistant

## 2020-10-13 ENCOUNTER — Other Ambulatory Visit: Payer: Self-pay | Admitting: Physician Assistant

## 2020-10-13 DIAGNOSIS — R140 Abdominal distension (gaseous): Secondary | ICD-10-CM

## 2020-10-15 ENCOUNTER — Telehealth: Payer: Self-pay

## 2020-10-15 NOTE — Telephone Encounter (Signed)
Dermatology appointment today @ [facility]-Toni

## 2020-10-18 ENCOUNTER — Encounter: Payer: Self-pay | Admitting: Physician Assistant

## 2020-10-18 LAB — FOOD ALLERGY PROFILE
Allergen Corn, IgE: <0.1 kU/L
Clam IgE: <0.1 kU/L
Codfish IgE: <0.1 kU/L
Egg White IgE: 0.27 kU/L — AB
Milk IgE: <0.1 kU/L
Peanut IgE: <0.1 kU/L
Scallop IgE: <0.1 kU/L
Sesame Seed IgE: <0.1 kU/L
Shrimp IgE: <0.1 kU/L
Soybean IgE: <0.1 kU/L
Walnut IgE: 0.1 kU/L — AB
Wheat IgE: <0.1 kU/L

## 2020-10-18 LAB — ALPHA-GAL PANEL
Allergen Lamb IgE: <0.1 kU/L
Beef IgE: <0.1 kU/L
IgE (Immunoglobulin E), Serum: 28 IU/mL (ref 6–495)
O215-IgE Alpha-Gal: <0.1 kU/L
Pork IgE: <0.1 kU/L

## 2020-10-19 ENCOUNTER — Other Ambulatory Visit: Payer: Self-pay | Admitting: Physician Assistant

## 2020-10-19 ENCOUNTER — Telehealth: Payer: Self-pay

## 2020-10-19 DIAGNOSIS — R140 Abdominal distension (gaseous): Secondary | ICD-10-CM

## 2020-10-19 DIAGNOSIS — Z0182 Encounter for allergy testing: Secondary | ICD-10-CM

## 2020-10-19 NOTE — Telephone Encounter (Signed)
Food allergy referral manually faxed to [facility]

## 2020-10-20 ENCOUNTER — Telehealth: Payer: Self-pay

## 2020-10-20 NOTE — Telephone Encounter (Signed)
Manually faxed referral again today-Toni

## 2020-10-27 NOTE — Telephone Encounter (Signed)
Spoke with [facility]. They have received referral. Still in review

## 2020-11-16 NOTE — Telephone Encounter (Signed)
Spoke with Kiana at [facility]. She stated patient can call to schedule appointment. I spoke with patient, gave her telephone # [phone]. She will call to schedule her appointment-Toni

## 2020-12-27 ENCOUNTER — Encounter: Payer: Self-pay | Admitting: Physician Assistant

## 2020-12-27 ENCOUNTER — Ambulatory Visit: Payer: Medicaid Other | Admitting: Physician Assistant

## 2020-12-27 ENCOUNTER — Other Ambulatory Visit: Payer: Self-pay

## 2020-12-27 DIAGNOSIS — R140 Abdominal distension (gaseous): Secondary | ICD-10-CM

## 2020-12-27 DIAGNOSIS — R41840 Attention and concentration deficit: Secondary | ICD-10-CM

## 2020-12-27 MED ORDER — AMPHETAMINE-DEXTROAMPHETAMINE 10 MG PO TABS: 10.0000 mg | ORAL_TABLET | Freq: Every day | ORAL | 0 refills | Status: DC | PRN

## 2020-12-27 NOTE — Progress Notes (Signed)
[facility] [address] [place], [place] [address]  Internal MEDICINE  Office Visit Note  Patient Name: Carolyn Edwards  [id number]  [id number]  Date of Service: 12/27/2020  Chief Complaint  Patient presents with   Follow-up   ADHD     HPI Pt is here for routine follow up -Dermatologist removed a spot on her nose and under eye. States one spot fas precancerous and she was told to come back for annual visits.  -Allergist scheduled for this week.  Abdominal bloating has been worse after thanksgiving because she decided to eat everything and not restrict gluten and other foods she had been avoiding. She is doing better since going back to eliminating certain foods and is excited to find out what other allergies she has that may be contributing. She may need to see GI as well in the future.  -She takes 1 tab of adderall some days as needed. States the 60tabs lasted her the 3 months with her skipping days. -Sleep and appetite are good. Denies any palpitations.  Current Medication: Outpatient Encounter Medications as of 12/27/2020  Medication Sig   amphetamine-dextroamphetamine (ADDERALL) 10 MG tablet Take 1 tablet (10 mg total) by mouth daily.   fluticasone (FLONASE) 50 MCG/ACT nasal spray Place 2 sprays into both nostrils daily.   Multiple Vitamins-Minerals (HAIR SKIN AND NAILS FORMULA) TABS Take 3 tablets by mouth daily.   valACYclovir (VALTREX) 1000 MG tablet Take 1 tablet (1,000 mg total) by mouth 2 (two) times daily.   [DISCONTINUED] amphetamine-dextroamphetamine (ADDERALL) 10 MG tablet Take 1 tablet (10 mg total) by mouth daily as needed.   amphetamine-dextroamphetamine (ADDERALL) 10 MG tablet Take 1 tablet (10 mg total) by mouth daily as needed.   No facility-administered encounter medications on file as of 12/27/2020.    Surgical History: Past Surgical History:  Procedure Laterality Date   BREAST ENHANCEMENT SURGERY     COLPOSCOPY  01/2005   bxs neg    FOREIGN BODY REMOVAL N/A 03/22/2017   Procedure: FOREIGN BODY REMOVAL;  Surgeon: Vena Austria, MD;  Location: [facility] ORS;  Service: Gynecology;  Laterality: N/A;   INTRAUTERINE DEVICE (IUD) INSERTION  08/2006   Mirena    Medical History: Past Medical History:  Diagnosis Date   ADHD    Dysmetabolic syndrome X 03/23/2011   Genital warts    LGSIL on Pap smear of cervix 2006   Migraine    Ovarian cyst 11/23/2015   Thyroid nodule     Family History: Family History  Problem Relation Age of Onset   Diabetes Mother    Colon cancer Maternal Grandfather 70    Social History   Socioeconomic History   Marital status: Married    Spouse name: Not on file   Number of children: Not on file   Years of education: Not on file   Highest education level: Not on file  Occupational History   Not on file  Tobacco Use   Smoking status: Never   Smokeless tobacco: Never  Vaping Use   Vaping Use: Never used  Substance and Sexual Activity   Alcohol use: No   Drug use: No   Sexual activity: Yes    Birth control/protection: None    Comment: pt has IUD surgically removed  Other Topics Concern   Not on file  Social History Narrative   Not on file   Social Determinants of Health   Financial Resource Strain: Not on file  Food Insecurity: Not on file  Transportation  Needs: Not on file  Physical Activity: Not on file  Stress: Not on file  Social Connections: Not on file  Intimate Partner Violence: Not on file      Review of Systems  Constitutional:  Negative for chills, diaphoresis and fatigue.  HENT:  Negative for ear pain, postnasal drip and sinus pressure.   Eyes:  Negative for photophobia, discharge, redness, itching and visual disturbance.  Respiratory:  Negative for cough, shortness of breath and wheezing.   Cardiovascular:  Negative for chest pain, palpitations and leg swelling.  Gastrointestinal:  Positive for abdominal distention and diarrhea. Negative for abdominal pain,  constipation, nausea and vomiting.  Genitourinary:  Negative for dysuria and flank pain.  Musculoskeletal:  Negative for arthralgias, back pain, gait problem and neck pain.  Skin:  Negative for color change.  Allergic/Immunologic: Negative for environmental allergies and food allergies.  Neurological:  Negative for dizziness and headaches.  Hematological:  Does not bruise/bleed easily.  Psychiatric/Behavioral:  Negative for agitation, behavioral problems (depression) and hallucinations.    Vital Signs: BP 117/78   Pulse 80   Temp 98 F (36.7 C)   Resp 16   Ht 5\' 5"  (1.651 m)   Wt 138 lb (62.6 kg)   SpO2 98%   BMI 22.96 kg/m    Physical Exam Vitals and nursing note reviewed.  Constitutional:      General: She is not in acute distress.    Appearance: She is well-developed and normal weight. She is not diaphoretic.  HENT:     Head: Normocephalic and atraumatic.     Mouth/Throat:     Pharynx: No oropharyngeal exudate.  Eyes:     Pupils: Pupils are equal, round, and reactive to light.  Neck:     Thyroid: No thyromegaly.     Vascular: No JVD.     Trachea: No tracheal deviation.  Cardiovascular:     Rate and Rhythm: Normal rate and regular rhythm.     Heart sounds: Normal heart sounds. No murmur heard.   No friction rub. No gallop.  Pulmonary:     Effort: Pulmonary effort is normal. No respiratory distress.     Breath sounds: No wheezing or rales.  Chest:     Chest wall: No tenderness.  Abdominal:     General: Bowel sounds are normal.     Palpations: Abdomen is soft.  Musculoskeletal:        General: Normal range of motion.     Cervical back: Normal range of motion and neck supple.  Lymphadenopathy:     Cervical: No cervical adenopathy.  Skin:    General: Skin is warm and dry.  Neurological:     Mental Status: She is alert and oriented to person, place, and time.     Cranial Nerves: No cranial nerve deficit.  Psychiatric:        Behavior: Behavior normal.         Thought Content: Thought content normal.        Judgment: Judgment normal.       Assessment/Plan: 1. Attention and concentration deficit May continue 1 tab daily as needed, Refill sent - amphetamine-dextroamphetamine (ADDERALL) 10 MG tablet; Take 1 tablet (10 mg total) by mouth daily as needed.  Dispense: 60 tablet; Refill: 0 [place] Controlled Substance Database was reviewed by me for overdose risk score (ORS) Refilled Controlled medications today. Reviewed risks and possible side effects associated with taking Stimulants. Combination of these drugs with other psychotropic medications could cause dizziness and  drowsiness. Pt needs to Monitor symptoms and exercise caution in driving and operating heavy machinery to avoid damages to oneself, to others and to the surroundings. Patient verbalized understanding in this matter. Dependence and abuse for these drugs will be monitored closely. A Controlled substance policy and procedure is on file which allows [facility] to order a urine drug screen test at any visit. Patient understands and agrees with the plan..   2. Abdominal bloating Has appt with allergist this week, may need to see GI in future   General Counseling: genavieve colavita understanding of the findings of todays visit and agrees with plan of treatment. I have discussed any further diagnostic evaluation that may be needed or ordered today. We also reviewed her medications today. she has been encouraged to call the office with any questions or concerns that should arise related to todays visit.    No orders of the defined types were placed in this encounter.   Meds ordered this encounter  Medications   amphetamine-dextroamphetamine (ADDERALL) 10 MG tablet    Sig: Take 1 tablet (10 mg total) by mouth daily as needed.    Dispense:  60 tablet    Refill:  0     This patient was seen by Drema Dallas, [name] in collaboration with Dr. Clayborn Bigness as a part of collaborative  care agreement.   Total time spent:30 Minutes Time spent includes review of chart, medications, test results, and follow up plan with the patient.      Dr Lavera Guise Internal medicine

## 2021-02-14 ENCOUNTER — Encounter: Payer: Self-pay | Admitting: Physician Assistant

## 2021-02-15 ENCOUNTER — Other Ambulatory Visit: Payer: Self-pay | Admitting: Physician Assistant

## 2021-02-15 DIAGNOSIS — R140 Abdominal distension (gaseous): Secondary | ICD-10-CM

## 2021-02-16 ENCOUNTER — Ambulatory Visit: Payer: Medicaid Other | Admitting: Dermatology

## 2021-02-16 ENCOUNTER — Telehealth: Payer: Self-pay

## 2021-02-16 NOTE — Telephone Encounter (Signed)
Scheduled for 03/01/2021

## 2021-03-01 ENCOUNTER — Other Ambulatory Visit: Payer: Self-pay | Admitting: Gastroenterology

## 2021-03-01 ENCOUNTER — Other Ambulatory Visit: Payer: Self-pay

## 2021-03-01 ENCOUNTER — Encounter: Payer: Self-pay | Admitting: Gastroenterology

## 2021-03-01 ENCOUNTER — Ambulatory Visit: Payer: Medicaid Other | Admitting: Gastroenterology

## 2021-03-01 VITALS — BP 117/78 | HR 78 | Temp 98.5°F | Ht 65.0 in | Wt 139.2 lb

## 2021-03-01 DIAGNOSIS — R140 Abdominal distension (gaseous): Secondary | ICD-10-CM

## 2021-03-01 DIAGNOSIS — K529 Noninfective gastroenteritis and colitis, unspecified: Secondary | ICD-10-CM | POA: Diagnosis not present

## 2021-03-01 MED ORDER — NA SULFATE-K SULFATE-MG SULF 17.5-3.13-1.6 GM/177ML PO SOLN: 354.0000 mL | Freq: Once | ORAL | 0 refills | Status: DC

## 2021-03-01 NOTE — Progress Notes (Signed)
Cephas Darby, MD [address]  [place]  [place], [place] [address]  Main: [phone]  Fax: [phone]    Gastroenterology Consultation  Referring Provider:     Mylinda Latina, PA* Primary Care Physician:  Lavera Guise, MD Primary Gastroenterologist:  Dr. Cephas Darby Reason for Consultation:     Abdominal bloating and loose stools        HPI:   Carolyn Edwards is a 36 y.o. female referred by Dr. Humphrey Rolls, Timoteo Gaul, MD  for consultation & management of abdominal bloating and loose stools.  Patient reports that for almost a year, she has been noticing lower abdominal bloating and her stools are always loose, 2-4 times daily without any blood.  She went on a complete gluten-free diet on her own and her bloating almost resolved.  Around Thanksgiving, she reintroduced gluten and her bloating worsened.  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  She was evaluated by an allergist and she was informed that she does not have any food allergies including eggs.  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  She denies any rectal bleeding, upper GI symptoms.  Her labs are unremarkable.  She is frustrated with abdominal bloating and feels helpless.  Does not smoke or drink alcohol.  She denies any out of ordinary stress in her life other than managing to hair salon's.  NSAIDs: None  Antiplts/Anticoagulants/Anti thrombotics: None  GI Procedures: None No known family history of IBD or celiac disease or GI malignancy  Past Medical History:  Diagnosis Date   ADHD    Dysmetabolic syndrome X [id number]   Genital warts    LGSIL on Pap smear of cervix 2006   Migraine    Ovarian cyst 11/23/2015   Thyroid nodule     Past Surgical History:  Procedure Laterality Date   BREAST ENHANCEMENT SURGERY     COLPOSCOPY  01/2005   bxs neg   FOREIGN BODY REMOVAL N/A 03/22/2017   Procedure: FOREIGN BODY REMOVAL;  Surgeon:  Malachy Mood, MD;  Location: [facility] ORS;  Service: Gynecology;  Laterality: N/A;   INTRAUTERINE DEVICE (IUD) INSERTION  08/2006   Mirena    Current Outpatient Medications:    amphetamine-dextroamphetamine (ADDERALL) 10 MG tablet, Take 1 tablet (10 mg total) by mouth daily., Disp: 30 tablet, Rfl: 0   amphetamine-dextroamphetamine (ADDERALL) 10 MG tablet, Take 1 tablet (10 mg total) by mouth daily as needed., Disp: 60 tablet, Rfl: 0   fluticasone (FLONASE) 50 MCG/ACT nasal spray, Place 2 sprays into both nostrils daily., Disp: 16 g, Rfl: 6   Multiple Vitamins-Minerals (HAIR SKIN AND NAILS FORMULA) TABS, Take 3 tablets by mouth daily., Disp: , Rfl:    valACYclovir (VALTREX) 1000 MG tablet, Take 1 tablet (1,000 mg total) by mouth 2 (two) times daily., Disp: 60 tablet, Rfl: 1  Family History  Problem Relation Age of Onset   Diabetes Mother    Colon cancer Maternal Grandfather 26     Social History   Tobacco Use   Smoking status: Never   Smokeless tobacco: Never  Vaping Use   Vaping Use: Never used  Substance Use Topics   Alcohol use: No   Drug use: No    Allergies as of 03/01/2021 - Review Complete 03/01/2021  Allergen Reaction Noted   Imitrex [sumatriptan] Anaphylaxis 09/11/2018    Review of Systems:    All systems reviewed and negative except  where noted in HPI.   Physical Exam:  BP 117/78 (BP Location: Left Arm, Patient Position: Sitting, Cuff Size: Normal)    Pulse 78    Temp 98.5 F (36.9 C) (Oral)    Ht 5\' 5"  (1.651 m)    Wt 139 lb 4 oz (63.2 kg)    BMI 23.17 kg/m  No LMP recorded.  General:   Alert,  Well-developed, well-nourished, pleasant and cooperative in NAD Head:  Normocephalic and atraumatic. Eyes:  Sclera clear, no icterus.   Conjunctiva pink. Ears:  Normal auditory acuity. Nose:  No deformity, discharge, or lesions. Mouth:  No deformity or lesions,oropharynx pink & moist. Neck:  Supple; no masses or thyromegaly. Lungs:  Respirations even and unlabored.   Clear throughout to auscultation.   No wheezes, crackles, or rhonchi. No acute distress. Heart:  Regular rate and rhythm; no murmurs, clicks, rubs, or gallops. Abdomen:  Normal bowel sounds. Soft, non-tender and mildly distended in the lower abdomen without masses, hepatosplenomegaly or hernias noted.  No guarding or rebound tenderness.   Rectal: Not performed Msk:  Symmetrical without gross deformities. Good, equal movement & strength bilaterally. Pulses:  Normal pulses noted. Extremities:  No clubbing or edema.  No cyanosis. Neurologic:  Alert and oriented x3;  grossly normal neurologically. Skin:  Intact without significant lesions or rashes. No jaundice. Psych:  Alert and cooperative. Normal mood and affect.  Imaging Studies: No abdominal imaging  Assessment and Plan:   Carolyn Edwards is a 36 y.o. pleasant Caucasian female with history of ADHD is seen in consultation for chronic lower abdominal bloating and nonbloody loose stools.  No evidence of anemia, TSH normal.  Patient reports that she always had sensitive stomach since her childhood Patient would like to undergo thorough evaluation as she is frustrated with ongoing symptoms Recommend GI profile PCR to rule out infection including C. difficile Check H. pylori stool antigen Check celiac disease panel Recheck TSH, check fecal calprotectin levels Recommend EGD and colonoscopy with TI evaluation and random colon biopsies If above work-up is negative, will empirically treat for bacterial overgrowth  Follow up in 3 to 4 months   Cephas Darby, MD

## 2021-03-02 ENCOUNTER — Encounter: Payer: Self-pay | Admitting: Gastroenterology

## 2021-03-02 ENCOUNTER — Other Ambulatory Visit (HOSPITAL_COMMUNITY)
Admission: RE | Admit: 2021-03-02 | Discharge: 2021-03-02 | Disposition: A | Discharge status: Home / Self Care | Payer: Medicaid Other | Source: Ambulatory Visit | Attending: Obstetrics and Gynecology | Admitting: Obstetrics and Gynecology

## 2021-03-02 ENCOUNTER — Ambulatory Visit (INDEPENDENT_AMBULATORY_CARE_PROVIDER_SITE_OTHER): Payer: Medicaid Other | Admitting: Obstetrics and Gynecology

## 2021-03-02 ENCOUNTER — Encounter: Payer: Self-pay | Admitting: Obstetrics and Gynecology

## 2021-03-02 ENCOUNTER — Other Ambulatory Visit: Payer: Self-pay

## 2021-03-02 VITALS — BP 100/60 | Ht 64.0 in | Wt 140.0 lb

## 2021-03-02 DIAGNOSIS — Z3169 Encounter for other general counseling and advice on procreation: Secondary | ICD-10-CM

## 2021-03-02 DIAGNOSIS — Z124 Encounter for screening for malignant neoplasm of cervix: Secondary | ICD-10-CM | POA: Diagnosis not present

## 2021-03-02 DIAGNOSIS — Z1151 Encounter for screening for human papillomavirus (HPV): Secondary | ICD-10-CM | POA: Insufficient documentation

## 2021-03-02 DIAGNOSIS — R140 Abdominal distension (gaseous): Secondary | ICD-10-CM

## 2021-03-02 DIAGNOSIS — Z01419 Encounter for gynecological examination (general) (routine) without abnormal findings: Secondary | ICD-10-CM | POA: Diagnosis not present

## 2021-03-02 DIAGNOSIS — Z0000 Encounter for general adult medical examination without abnormal findings: Secondary | ICD-10-CM

## 2021-03-02 NOTE — Patient Instructions (Signed)
I value your feedback and you entrusting us with your care. If you get a [facility] patient survey, I would appreciate you taking the time to let us know about your experience today. Thank you! ? ? ?

## 2021-03-02 NOTE — Progress Notes (Addendum)
PCP:  Lyndon Code, MD   Chief Complaint  Patient presents with   Gynecologic Exam    Swelling in lower abdomen     HPI:      [name] is a 36 y.o. G1P1001 whose LMP was Patient's last menstrual period was 02/19/2021 (approximate)., presents today for her NP > 3 yrs annual examination.  Her menses are regular every 27-28 days, lasting 4-5 days, light flow, no BTB.  Dysmenorrhea moderate, improved with heating pad/NSAIDs. She does not have intermenstrual bleeding. LMP was after 21 days instead of normal 28.  Sex activity: single partner, contraception - none. No pain/bleeding. Pt has not been on [facility] for 4 yrs and no conception. Taking PNVs. Would like to conceive now. Has 48 yo child, s/p SVD. Hx of chlamydia in distant past, no PID. Husband without children. Did home semen analysis that was normal. No hx of testicular trauma/masses.   Last Pap: 11/27/19 Results were: ASCUS with NEGATIVE high risk HPV ; hx of LGSIL in 2006 Hx of STDs: chlamydia, HPV  There is no FH of breast cancer. There is no FH of ovarian cancer. The patient does do self-breast exams.  Tobacco use: The patient denies current or previous tobacco use. Alcohol use: none No drug use.  Exercise: moderately active  She does get adequate calcium and Vitamin D in her diet.  Pt with issues of progressive bloating/swelling for close to a yr, not better in AM. Also with loose stools several times daily, occas blood in stool due to frequency, and feeling "gas-y". Lower abd is tender to palpate.  Has tried gluten free without sx relief. FH colon cancer in both grandfathers. Pt saw GI, doing stool samples and has colonoscopy 03/15/21.   Patient Active Problem List   Diagnosis Date Noted   Other fatigue 10/25/2017   Attention and concentration deficit 10/25/2017    Past Surgical History:  Procedure Laterality Date   BREAST ENHANCEMENT SURGERY     COLPOSCOPY  01/2005   bxs neg   FOREIGN BODY REMOVAL N/A  03/22/2017   Procedure: FOREIGN BODY REMOVAL;  Surgeon: Vena Austria, MD;  Location: [facility] ORS;  Service: Gynecology;  Laterality: N/A;   INTRAUTERINE DEVICE (IUD) INSERTION  08/2006   Mirena    Family History  Problem Relation Age of Onset   Diabetes Mother    Colon cancer Maternal Grandfather 93   Colon cancer Paternal Grandfather     Social History   Socioeconomic History   Marital status: Married    Spouse name: Not on file   Number of children: Not on file   Years of education: Not on file   Highest education level: Not on file  Occupational History   Not on file  Tobacco Use   Smoking status: Never   Smokeless tobacco: Never  Vaping Use   Vaping Use: Never used  Substance and Sexual Activity   Alcohol use: No   Drug use: No   Sexual activity: Yes    Birth control/protection: None    Comment: pt has IUD surgically removed  Other Topics Concern   Not on file  Social History Narrative   Not on file   Social Determinants of Health   Financial Resource Strain: Not on file  Food Insecurity: Not on file  Transportation Needs: Not on file  Physical Activity: Not on file  Stress: Not on file  Social Connections: Not on file  Intimate Partner Violence: Not on file  Current Outpatient Medications:    amphetamine-dextroamphetamine (ADDERALL) 10 MG tablet, Take 1 tablet (10 mg total) by mouth daily., Disp: 30 tablet, Rfl: 0   amphetamine-dextroamphetamine (ADDERALL) 10 MG tablet, Take 1 tablet (10 mg total) by mouth daily as needed., Disp: 60 tablet, Rfl: 0   Multiple Vitamins-Minerals (HAIR SKIN AND NAILS FORMULA) TABS, Take 3 tablets by mouth daily., Disp: , Rfl:    valACYclovir (VALTREX) 1000 MG tablet, Take 1 tablet (1,000 mg total) by mouth 2 (two) times daily., Disp: 60 tablet, Rfl: 1     ROS:  Review of Systems  Constitutional:  Negative for fatigue, fever and unexpected weight change.  Respiratory:  Negative for cough, shortness of breath and  wheezing.   Cardiovascular:  Negative for chest pain, palpitations and leg swelling.  Gastrointestinal:  Positive for abdominal distention. Negative for blood in stool, constipation, diarrhea, nausea and vomiting.  Endocrine: Negative for cold intolerance, heat intolerance and polyuria.  Genitourinary:  Negative for dyspareunia, dysuria, flank pain, frequency, genital sores, hematuria, menstrual problem, pelvic pain, urgency, vaginal bleeding, vaginal discharge and vaginal pain.  Musculoskeletal:  Negative for back pain, joint swelling and myalgias.  Skin:  Negative for rash.  Neurological:  Negative for dizziness, syncope, light-headedness, numbness and headaches.  Hematological:  Negative for adenopathy.  Psychiatric/Behavioral:  Negative for agitation, confusion, sleep disturbance and suicidal ideas. The patient is not nervous/anxious.   BREAST: No symptoms   Objective: BP 100/60    Ht 5\' 4"  (1.626 m)    Wt 140 lb (63.5 kg)    LMP 02/19/2021 (Approximate)    BMI 24.03 kg/m    Physical Exam Constitutional:      Appearance: She is well-developed.  Genitourinary:     Vulva normal.     Right Labia: No rash, tenderness or lesions.    Left Labia: No tenderness, lesions or rash.    No vaginal discharge, erythema or tenderness.      Right Adnexa: tender.    Right Adnexa: no mass present.    Left Adnexa: tender.    Left Adnexa: no mass present.    No cervical friability or polyp.     Uterus is tender.     Uterus is not enlarged.  Breasts:    Right: No mass, nipple discharge, skin change or tenderness.     Left: No mass, nipple discharge, skin change or tenderness.  Neck:     Thyroid: No thyromegaly.  Cardiovascular:     Rate and Rhythm: Normal rate and regular rhythm.     Heart sounds: Normal heart sounds. No murmur heard. Pulmonary:     Effort: Pulmonary effort is normal.     Breath sounds: Normal breath sounds.  Abdominal:     Palpations: Abdomen is soft.     Tenderness:  There is abdominal tenderness in the right lower quadrant, suprapubic area and left lower quadrant. There is no guarding or rebound.  Musculoskeletal:        General: Normal range of motion.     Cervical back: Normal range of motion.  Lymphadenopathy:     Cervical: No cervical adenopathy.  Neurological:     General: No focal deficit present.     Mental Status: She is alert and oriented to person, place, and time.     Cranial Nerves: No cranial nerve deficit.  Skin:    General: Skin is warm and dry.  Psychiatric:        Mood and Affect: Mood normal.  Behavior: Behavior normal.        Thought Content: Thought content normal.        Judgment: Judgment normal.  Vitals reviewed.    Assessment/Plan: Encounter for annual routine gynecological examination  Cervical cancer screening - Plan: Cytology - PAP  Screening for HPV (human papillomavirus) - Plan: Cytology - PAP  Infertility counseling - Plan: Progesterone; check serum prog 03/11/21 day 21. Will f/u with results. If normal, will do medical grade semen analysis for husband, then HSG prn.   Bloating--pt seeing GI for eval. Will f/u after colonoscopy. If all WNL, can check GYN u/s.    GYN counsel adequate intake of calcium and vitamin D, diet and exercise     F/U  Return for 03/11/21 lab appt./ 1 yr annual  [place] B. Shetara Launer, [name] 03/02/2021 4:45 PM

## 2021-03-03 LAB — TSH: TSH: 1.1 u[IU]/mL (ref 0.450–4.500)

## 2021-03-03 LAB — CELIAC DISEASE PANEL
Endomysial IgA: NEGATIVE
IgA/Immunoglobulin A, Serum: 126 mg/dL (ref 87–352)
Transglutaminase IgA: <2 U/mL (ref 0–3)

## 2021-03-04 LAB — H. PYLORI ANTIGEN, STOOL: H pylori Ag, Stl: NEGATIVE

## 2021-03-04 LAB — CALPROTECTIN, FECAL: Calprotectin, Fecal: <16 ug/g (ref 0–120)

## 2021-03-05 LAB — GI PROFILE, STOOL, PCR

## 2021-03-06 LAB — C DIFFICILE, CYTOTOXIN B

## 2021-03-06 LAB — C DIFFICILE TOXINS A+B W/RFLX: C difficile Toxins A+B, EIA: NEGATIVE

## 2021-03-07 ENCOUNTER — Encounter: Payer: Self-pay | Admitting: Gastroenterology

## 2021-03-07 ENCOUNTER — Encounter: Payer: Self-pay | Admitting: Obstetrics and Gynecology

## 2021-03-07 LAB — CYTOLOGY - PAP
Comment: NEGATIVE
Diagnosis: UNDETERMINED — AB
High risk HPV: NEGATIVE

## 2021-03-11 ENCOUNTER — Other Ambulatory Visit: Payer: Medicaid Other

## 2021-03-11 ENCOUNTER — Other Ambulatory Visit: Payer: Self-pay

## 2021-03-11 DIAGNOSIS — Z3169 Encounter for other general counseling and advice on procreation: Secondary | ICD-10-CM

## 2021-03-12 ENCOUNTER — Encounter: Payer: Self-pay | Admitting: Obstetrics and Gynecology

## 2021-03-12 DIAGNOSIS — R140 Abdominal distension (gaseous): Secondary | ICD-10-CM

## 2021-03-12 LAB — PROGESTERONE: Progesterone: 11.4 ng/mL

## 2021-03-15 ENCOUNTER — Encounter: Payer: Self-pay | Admitting: Gastroenterology

## 2021-03-15 ENCOUNTER — Other Ambulatory Visit: Payer: Self-pay

## 2021-03-15 ENCOUNTER — Ambulatory Visit
Admission: RE | Admit: 2021-03-15 | Discharge: 2021-03-15 | Disposition: A | Discharge status: Home / Self Care | Payer: Medicaid Other | Attending: Gastroenterology | Admitting: Gastroenterology

## 2021-03-15 ENCOUNTER — Encounter
Admission: RE | Disposition: A | Discharge status: Home / Self Care | Payer: Self-pay | Source: Home / Self Care | Attending: Gastroenterology

## 2021-03-15 ENCOUNTER — Ambulatory Visit: Payer: Medicaid Other | Admitting: Anesthesiology

## 2021-03-15 DIAGNOSIS — G43909 Migraine, unspecified, not intractable, without status migrainosus: Secondary | ICD-10-CM | POA: Diagnosis not present

## 2021-03-15 DIAGNOSIS — K529 Noninfective gastroenteritis and colitis, unspecified: Secondary | ICD-10-CM

## 2021-03-15 DIAGNOSIS — F909 Attention-deficit hyperactivity disorder, unspecified type: Secondary | ICD-10-CM | POA: Insufficient documentation

## 2021-03-15 DIAGNOSIS — R140 Abdominal distension (gaseous): Secondary | ICD-10-CM | POA: Diagnosis not present

## 2021-03-15 HISTORY — PX: COLONOSCOPY WITH PROPOFOL: SHX5780

## 2021-03-15 HISTORY — PX: BIOPSY: SHX5522

## 2021-03-15 HISTORY — PX: ESOPHAGOGASTRODUODENOSCOPY (EGD) WITH PROPOFOL: SHX5813

## 2021-03-15 LAB — POCT PREGNANCY, URINE: Preg Test, Ur: NEGATIVE

## 2021-03-15 SURGERY — COLONOSCOPY WITH PROPOFOL
Anesthesia: General | Site: Rectum

## 2021-03-15 MED ORDER — LACTATED RINGERS IV SOLN: INTRAVENOUS | Status: DC

## 2021-03-15 MED ORDER — LIDOCAINE HCL (CARDIAC) PF 100 MG/5ML IV SOSY
PREFILLED_SYRINGE | INTRAVENOUS | Status: DC | PRN
  Administered 2021-03-15: 60 mg via INTRAVENOUS

## 2021-03-15 MED ORDER — SODIUM CHLORIDE 0.9 % IV SOLN: INTRAVENOUS | Status: DC

## 2021-03-15 MED ORDER — PROPOFOL 10 MG/ML IV BOLUS
INTRAVENOUS | Status: DC | PRN
  Administered 2021-03-15: 70 mg via INTRAVENOUS
  Administered 2021-03-15: 30 mg via INTRAVENOUS

## 2021-03-15 MED ORDER — PROPOFOL 500 MG/50ML IV EMUL
INTRAVENOUS | Status: DC | PRN
  Administered 2021-03-15: 140 ug/kg/min via INTRAVENOUS

## 2021-03-15 SURGICAL SUPPLY — 38 items
BALLN DILATOR 10-12 8 (BALLOONS)
BALLN DILATOR 12-15 8 (BALLOONS)
BALLN DILATOR 15-18 8 (BALLOONS)
BALLN DILATOR CRE 0-12 8 (BALLOONS)
BALLN DILATOR ESOPH 8 10 CRE (MISCELLANEOUS) IMPLANT
BALLOON DILATOR 12-15 8 (BALLOONS) IMPLANT
BALLOON DILATOR 15-18 8 (BALLOONS) IMPLANT
BALLOON DILATOR CRE 0-12 8 (BALLOONS) IMPLANT
BLOCK BITE 60FR ADLT L/F GRN (MISCELLANEOUS) ×3 IMPLANT
CLIP HMST 235XBRD CATH ROT (MISCELLANEOUS) IMPLANT
CLIP RESOLUTION 360 11X235 (MISCELLANEOUS)
ELECT REM PT RETURN 9FT ADLT (ELECTROSURGICAL)
ELECTRODE REM PT RTRN 9FT ADLT (ELECTROSURGICAL) IMPLANT
FCP ESCP3.2XJMB 240X2.8X (MISCELLANEOUS)
FORCEPS BIOP RAD 4 LRG CAP 4 (CUTTING FORCEPS) ×2 IMPLANT
FORCEPS BIOP RJ4 240 W/NDL (MISCELLANEOUS)
FORCEPS ESCP3.2XJMB 240X2.8X (MISCELLANEOUS) IMPLANT
GOWN CVR UNV OPN BCK APRN NK (MISCELLANEOUS) ×4 IMPLANT
GOWN ISOL THUMB LOOP REG UNIV (MISCELLANEOUS) ×6
INJECTOR VARIJECT VIN23 (MISCELLANEOUS) IMPLANT
KIT DEFENDO VALVE AND CONN (KITS) IMPLANT
KIT PRC NS LF DISP ENDO (KITS) ×2 IMPLANT
KIT PROCEDURE OLYMPUS (KITS) ×3
MANIFOLD NEPTUNE II (INSTRUMENTS) ×3 IMPLANT
MARKER SPOT ENDO TATTOO 5ML (MISCELLANEOUS) IMPLANT
PROBE APC STR FIRE (PROBE) IMPLANT
RETRIEVER NET PLAT FOOD (MISCELLANEOUS) IMPLANT
RETRIEVER NET ROTH 2.5X230 LF (MISCELLANEOUS) IMPLANT
SNARE COLD EXACTO (MISCELLANEOUS) IMPLANT
SNARE SHORT THROW 13M SML OVAL (MISCELLANEOUS) IMPLANT
SNARE SHORT THROW 30M LRG OVAL (MISCELLANEOUS) IMPLANT
SNARE SNG USE RND 15MM (INSTRUMENTS) IMPLANT
SPOT EX ENDOSCOPIC TATTOO (MISCELLANEOUS)
SYR INFLATION 60ML (SYRINGE) IMPLANT
TRAP ETRAP POLY (MISCELLANEOUS) IMPLANT
VARIJECT INJECTOR VIN23 (MISCELLANEOUS)
WATER STERILE IRR 250ML POUR (IV SOLUTION) ×3 IMPLANT
WIRE CRE 18-20MM 8CM F G (MISCELLANEOUS) IMPLANT

## 2021-03-15 NOTE — H&P (Signed)
Arlyss Repress, MD [address]  [address]  [place], [place] [address]  Main: [phone]  Fax: [phone] Pager: [phone]  Primary Care Physician:  Lyndon Code, MD Primary Gastroenterologist:  Dr. Arlyss Repress  Pre-Procedure History & Physical: HPI:  Carolyn Edwards is a 36 y.o. female is here for an endoscopy and colonoscopy.   Past Medical History:  Diagnosis Date   ADHD    Dysmetabolic syndrome X 03/23/2011   Genital warts    LGSIL on Pap smear of cervix 2006   Migraine    Ovarian cyst 11/23/2015   Thyroid nodule     Past Surgical History:  Procedure Laterality Date   BREAST ENHANCEMENT SURGERY     COLPOSCOPY  01/2005   bxs neg   FOREIGN BODY REMOVAL N/A 03/22/2017   Procedure: FOREIGN BODY REMOVAL;  Surgeon: Vena Austria, MD;  Location: [facility] ORS;  Service: Gynecology;  Laterality: N/A;   INTRAUTERINE DEVICE (IUD) INSERTION  08/2006   Mirena    Prior to Admission medications   Medication Sig Start Date End Date Taking? Authorizing Provider  amphetamine-dextroamphetamine (ADDERALL) 10 MG tablet Take 1 tablet (10 mg total) by mouth daily. 05/24/20  Yes Theotis Burrow, NP  Multiple Vitamins-Minerals (HAIR SKIN AND NAILS FORMULA) TABS Take 3 tablets by mouth daily.   Yes [provider]  valACYclovir (VALTREX) 1000 MG tablet Take 1 tablet (1,000 mg total) by mouth 2 (two) times daily. 09/23/20  Yes McDonough, Lauren K, [name]  amphetamine-dextroamphetamine (ADDERALL) 10 MG tablet Take 1 tablet (10 mg total) by mouth daily as needed. 12/27/20   McDonough, Salomon Fick, [name]    Allergies as of 03/01/2021 - Review Complete 03/01/2021  Allergen Reaction Noted   Imitrex [sumatriptan] Anaphylaxis 09/11/2018    Family History  Problem Relation Age of Onset   Diabetes Mother    Colon cancer Maternal Grandfather 82   Colon cancer Paternal Grandfather     Social History   Socioeconomic History   Marital status: Married    Spouse name:  Not on file   Number of children: Not on file   Years of education: Not on file   Highest education level: Not on file  Occupational History   Not on file  Tobacco Use   Smoking status: Never   Smokeless tobacco: Never  Vaping Use   Vaping Use: Never used  Substance and Sexual Activity   Alcohol use: No   Drug use: No   Sexual activity: Yes    Birth control/protection: None    Comment: pt has IUD surgically removed  Other Topics Concern   Not on file  Social History Narrative   Not on file   Social Determinants of Health   Financial Resource Strain: Not on file  Food Insecurity: Not on file  Transportation Needs: Not on file  Physical Activity: Not on file  Stress: Not on file  Social Connections: Not on file  Intimate Partner Violence: Not on file    Review of Systems: See HPI, otherwise negative ROS  Physical Exam: BP 113/72    Pulse 77    Temp 98.1 F (36.7 C) (Temporal)    Wt 62.6 kg    LMP 02/19/2021 (Approximate)    SpO2 100%    BMI 23.69 kg/m  General:   Alert,  pleasant and cooperative in NAD Head:  Normocephalic and atraumatic. Neck:  Supple; no masses or thyromegaly. Lungs:  Clear throughout to auscultation.    Heart:  Regular rate and rhythm. Abdomen:  Soft, nontender and nondistended. Normal bowel sounds, without guarding, and without rebound.   Neurologic:  Alert and  oriented x4;  grossly normal neurologically.  Impression/Plan: Carolyn Edwards is here for an endoscopy and colonoscopy to be performed for chronic diarrhea, abdominal bloating  Risks, benefits, limitations, and alternatives regarding  endoscopy and colonoscopy have been reviewed with the patient.  Questions have been answered.  All parties agreeable.   Lannette Donath, MD  03/15/2021, 7:33 AM

## 2021-03-15 NOTE — Op Note (Signed)
[facility] Gastroenterology Patient Name: Carolyn Edwards Procedure Date: 03/15/2021 8:01 AM MRN: [id number] Account #: [id number] Date of Birth: 12/14/1985 Admit Type: Outpatient Age: 36 Room: [facility] OR ROOM 01 Gender: Female Note Status: Finalized Instrument Name: [id number] Procedure:             Colonoscopy Indications:           This is the patient's first colonoscopy, Clinically                         significant diarrhea of unexplained origin Providers:             Toney Reil MD, MD Referring MD:          Lyndon Code, MD (Referring MD) Medicines:             General Anesthesia Complications:         No immediate complications. Estimated blood loss: None. Procedure:             Pre-Anesthesia Assessment:                        - Prior to the procedure, a History and Physical was                         performed, and patient medications and allergies were                         reviewed. The patient is competent. The risks and                         benefits of the procedure and the sedation options and                         risks were discussed with the patient. All questions                         were answered and informed consent was obtained.                         Patient identification and proposed procedure were                         verified by the physician, the nurse, the                         anesthesiologist, the anesthetist and the technician                         in the pre-procedure area in the procedure room in the                         endoscopy suite. Mental Status Examination: alert and                         oriented. Airway Examination: normal oropharyngeal                         airway and neck mobility. Respiratory Examination:  clear to auscultation. CV Examination: normal.                         Prophylactic Antibiotics: The patient does not require                         prophylactic  antibiotics. Prior Anticoagulants: The                         patient has taken no previous anticoagulant or                         antiplatelet agents. ASA Grade Assessment: II - A                         patient with mild systemic disease. After reviewing                         the risks and benefits, the patient was deemed in                         satisfactory condition to undergo the procedure. The                         anesthesia plan was to use general anesthesia.                         Immediately prior to administration of medications,                         the patient was re-assessed for adequacy to receive                         sedatives. The heart rate, respiratory rate, oxygen                         saturations, blood pressure, adequacy of pulmonary                         ventilation, and response to care were monitored                         throughout the procedure. The physical status of the                         patient was re-assessed after the procedure.                        After obtaining informed consent, the colonoscope was                         passed under direct vision. Throughout the procedure,                         the patient's blood pressure, pulse, and oxygen                         saturations were monitored continuously. The  Colonoscope was introduced through the anus and                         advanced to the 10 cm into the ileum. The colonoscopy                         was performed without difficulty. The patient                         tolerated the procedure well. The quality of the bowel                         preparation was evaluated using the BBPS [facility] Bowel                         Preparation Scale) with scores of: Right Colon = 3,                         Transverse Colon = 3 and Left Colon = 3 (entire mucosa                         seen well with no residual staining, small fragments                          of stool or opaque liquid). The total BBPS score                         equals 9. Findings:      The perianal and digital rectal examinations were normal. Pertinent       negatives include normal sphincter tone and no palpable rectal lesions.      The terminal ileum appeared normal.      The entire examined colon appeared normal. Biopsies were taken with a       cold forceps for histology.      The retroflexed view of the distal rectum and anal verge was normal and       showed no anal or rectal abnormalities. Impression:            - The examined portion of the ileum was normal.                        - The entire examined colon is normal. Biopsied.                        - The distal rectum and anal verge are normal on                         retroflexion view. Recommendation:        - Discharge patient to home (with escort).                        - Resume regular diet today.                        - Await pathology results.                        - Return to  my office as previously scheduled. Procedure Code(s):     --- Professional ---                        [phone], Colonoscopy, flexible; with biopsy, single or                         multiple Diagnosis Code(s):     --- Professional ---                        R19.7, Diarrhea, unspecified CPT copyright 2019 American Medical Association. All rights reserved. The codes documented in this report are preliminary and upon coder review may  be revised to meet current compliance requirements. Dr. Libby Maw Toney Reil MD, MD 03/15/2021 8:38:53 AM This report has been signed electronically. Number of Addenda: 0 Note Initiated On: 03/15/2021 8:01 AM Scope Withdrawal Time: 0 hours 8 minutes 54 seconds  Total Procedure Duration: 0 hours 11 minutes 52 seconds  Estimated Blood Loss:  Estimated blood loss: none.      [facility]

## 2021-03-15 NOTE — Anesthesia Preprocedure Evaluation (Signed)
Anesthesia Evaluation  Patient identified by MRN, date of birth, ID band Patient awake    Reviewed: Allergy & Precautions, NPO status , Patient's Chart, lab work & pertinent test results  Airway Mallampati: II  TM Distance: >3 FB Neck ROM: Full    Dental no notable dental hx.    Pulmonary neg pulmonary ROS,    Pulmonary exam normal        Cardiovascular negative cardio ROS Normal cardiovascular exam     Neuro/Psych  Headaches, ADHD   GI/Hepatic Neg liver ROS, Chronic diarrhea    Endo/Other  negative endocrine ROS  Renal/GU negative Renal ROS     Musculoskeletal negative musculoskeletal ROS (+)   Abdominal Normal abdominal exam  (+)   Peds  Hematology negative hematology ROS (+)   Anesthesia Other Findings   Reproductive/Obstetrics                             Anesthesia Physical Anesthesia Plan  ASA: 2  Anesthesia Plan: General   Post-op Pain Management: Minimal or no pain anticipated   Induction: Intravenous  PONV Risk Score and Plan: 3 and Propofol infusion, TIVA and Treatment may vary due to age or medical condition  Airway Management Planned: Nasal Cannula and Natural Airway  Additional Equipment: None  Intra-op Plan:   Post-operative Plan:   Informed Consent: I have reviewed the patients History and Physical, chart, labs and discussed the procedure including the risks, benefits and alternatives for the proposed anesthesia with the patient or authorized representative who has indicated his/her understanding and acceptance.     Dental advisory given  Plan Discussed with: CRNA  Anesthesia Plan Comments:         Anesthesia Quick Evaluation

## 2021-03-15 NOTE — Anesthesia Procedure Notes (Signed)
Date/Time: 03/15/2021 8:07 AM Performed by: Lily Kocher, CRNA Pre-anesthesia Checklist: Patient identified, Emergency Drugs available, Suction available, Patient being monitored and Timeout performed Patient Re-evaluated:Patient Re-evaluated prior to induction Oxygen Delivery Method: Nasal cannula Placement Confirmation: positive ETCO2

## 2021-03-15 NOTE — Anesthesia Postprocedure Evaluation (Signed)
Anesthesia Post Note  Patient: Carolyn Edwards  Procedure(s) Performed: COLONOSCOPY WITH PROPOFOL (Rectum) ESOPHAGOGASTRODUODENOSCOPY (EGD) WITH PROPOFOL (Esophagus) BIOPSY (Rectum)     Patient location during evaluation: PACU Anesthesia Type: General Level of consciousness: awake and alert Pain management: pain level controlled Vital Signs Assessment: post-procedure vital signs reviewed and stable Respiratory status: nonlabored ventilation and spontaneous breathing Postop Assessment: no apparent nausea or vomiting Anesthetic complications: no   No notable events documented.  Laurence Crofford [employer]

## 2021-03-15 NOTE — Transfer of Care (Signed)
Immediate Anesthesia Transfer of Care Note  Patient: Carolyn Edwards  Procedure(s) Performed: COLONOSCOPY WITH PROPOFOL (Rectum) ESOPHAGOGASTRODUODENOSCOPY (EGD) WITH PROPOFOL (Esophagus) BIOPSY (Rectum)  Patient Location: PACU  Anesthesia Type: General  Level of Consciousness: awake, alert  and patient cooperative  Airway and Oxygen Therapy: Patient Spontanous Breathing and Patient connected to supplemental oxygen  Post-op Assessment: Post-op Vital signs reviewed, Patient's Cardiovascular Status Stable, Respiratory Function Stable, Patent Airway and No signs of Nausea or vomiting  Post-op Vital Signs: Reviewed and stable  Complications: No notable events documented.

## 2021-03-15 NOTE — Op Note (Signed)
[facility] Gastroenterology Patient Name: Carolyn Edwards Procedure Date: 03/15/2021 8:02 AM MRN: [id number] Account #: [id number] Date of Birth: 04/15/1985 Admit Type: Outpatient Age: 36 Room: [facility] OR ROOM 01 Gender: Female Note Status: Finalized Instrument Name: [id number] Procedure:             Upper GI endoscopy Indications:           Diarrhea Providers:             Toney Reil MD, MD Referring MD:          Lyndon Code, MD (Referring MD) Medicines:             General Anesthesia Complications:         No immediate complications. Estimated blood loss: None. Procedure:             Pre-Anesthesia Assessment:                        - Prior to the procedure, a History and Physical was                         performed, and patient medications and allergies were                         reviewed. The patient is competent. The risks and                         benefits of the procedure and the sedation options and                         risks were discussed with the patient. All questions                         were answered and informed consent was obtained.                         Patient identification and proposed procedure were                         verified by the physician, the nurse, the                         anesthesiologist, the anesthetist and the technician                         in the pre-procedure area in the procedure room in the                         endoscopy suite. Mental Status Examination: alert and                         oriented. Airway Examination: normal oropharyngeal                         airway and neck mobility. Respiratory Examination:                         clear to auscultation. CV Examination: normal.  Prophylactic Antibiotics: The patient does not require                         prophylactic antibiotics. Prior Anticoagulants: The                         patient has taken no previous  anticoagulant or                         antiplatelet agents. ASA Grade Assessment: II - A                         patient with mild systemic disease. After reviewing                         the risks and benefits, the patient was deemed in                         satisfactory condition to undergo the procedure. The                         anesthesia plan was to use general anesthesia.                         Immediately prior to administration of medications,                         the patient was re-assessed for adequacy to receive                         sedatives. The heart rate, respiratory rate, oxygen                         saturations, blood pressure, adequacy of pulmonary                         ventilation, and response to care were monitored                         throughout the procedure. The physical status of the                         patient was re-assessed after the procedure.                        After obtaining informed consent, the endoscope was                         passed under direct vision. Throughout the procedure,                         the patient's blood pressure, pulse, and oxygen                         saturations were monitored continuously. The Endoscope                         was introduced through the mouth, and advanced to the  second part of duodenum. The upper GI endoscopy was                         accomplished without difficulty. The patient tolerated                         the procedure fairly well. Findings:      The duodenal bulb and second portion of the duodenum were normal.       Biopsies were taken with a cold forceps for histology.      The entire examined stomach was normal. Biopsies were taken with a cold       forceps for histology.      The gastroesophageal junction and examined esophagus were normal. Impression:            - Normal duodenal bulb and second portion of the                          duodenum. Biopsied.                        - Normal stomach. Biopsied.                        - Normal gastroesophageal junction and esophagus. Recommendation:        - Await pathology results.                        - Proceed with colonoscopy as scheduled                        See colonoscopy report Procedure Code(s):     --- Professional ---                        [phone], Esophagogastroduodenoscopy, flexible,                         transoral; with biopsy, single or multiple Diagnosis Code(s):     --- Professional ---                        R19.7, Diarrhea, unspecified CPT copyright 2019 American Medical Association. All rights reserved. The codes documented in this report are preliminary and upon coder review may  be revised to meet current compliance requirements. Dr. Libby Maw Toney Reil MD, MD 03/15/2021 8:22:55 AM This report has been signed electronically. Number of Addenda: 0 Note Initiated On: 03/15/2021 8:02 AM Total Procedure Duration: 0 hours 9 minutes 1 second  Estimated Blood Loss:  Estimated blood loss: none.      [facility]

## 2021-03-16 ENCOUNTER — Encounter: Payer: Self-pay | Admitting: Gastroenterology

## 2021-03-17 LAB — SURGICAL PATHOLOGY

## 2021-03-23 ENCOUNTER — Other Ambulatory Visit: Payer: Self-pay

## 2021-03-23 ENCOUNTER — Ambulatory Visit
Admission: RE | Admit: 2021-03-23 | Discharge: 2021-03-23 | Disposition: A | Discharge status: Home / Self Care | Payer: Medicaid Other | Source: Ambulatory Visit | Attending: Obstetrics and Gynecology | Admitting: Obstetrics and Gynecology

## 2021-03-23 DIAGNOSIS — R140 Abdominal distension (gaseous): Secondary | ICD-10-CM | POA: Insufficient documentation

## 2021-03-24 ENCOUNTER — Encounter: Payer: Self-pay | Admitting: Obstetrics and Gynecology

## 2021-03-28 ENCOUNTER — Ambulatory Visit: Payer: Medicaid Other | Admitting: Physician Assistant

## 2021-03-28 ENCOUNTER — Encounter: Payer: Self-pay | Admitting: Physician Assistant

## 2021-03-28 ENCOUNTER — Other Ambulatory Visit: Payer: Self-pay

## 2021-03-28 DIAGNOSIS — R140 Abdominal distension (gaseous): Secondary | ICD-10-CM

## 2021-03-28 DIAGNOSIS — R41840 Attention and concentration deficit: Secondary | ICD-10-CM

## 2021-03-28 MED ORDER — AMPHETAMINE-DEXTROAMPHETAMINE 10 MG PO TABS: 10.0000 mg | ORAL_TABLET | Freq: Every day | ORAL | 0 refills | Status: DC | PRN

## 2021-03-28 NOTE — Progress Notes (Signed)
[facility] [place], [place] [address]  Internal MEDICINE  Office Visit Note  Patient Name: Carolyn Edwards  [id number]  [id number]  Date of Service: 03/28/2021  Chief Complaint  Patient presents with   Follow-up    HPI Patient is here for routine follow-up -Has been seeing GI, had a colonoscopy and labs, and was diagnosed with IBS and is getting better. Taking probiotic -Had allergy testing and not allergic to any foods -Has been taking adderall more often recently, taking 5 days per week, just 1 tab per day -No side effects, not impacting sleep and no palpitations -No other concerns today  Current Medication: Outpatient Encounter Medications as of 03/28/2021  Medication Sig   Multiple Vitamins-Minerals (HAIR SKIN AND NAILS FORMULA) TABS Take 3 tablets by mouth daily.   valACYclovir (VALTREX) 1000 MG tablet Take 1 tablet (1,000 mg total) by mouth 2 (two) times daily.   [DISCONTINUED] amphetamine-dextroamphetamine (ADDERALL) 10 MG tablet Take 1 tablet (10 mg total) by mouth daily.   [DISCONTINUED] amphetamine-dextroamphetamine (ADDERALL) 10 MG tablet Take 1 tablet (10 mg total) by mouth daily as needed.   amphetamine-dextroamphetamine (ADDERALL) 10 MG tablet Take 1 tablet (10 mg total) by mouth daily as needed.   No facility-administered encounter medications on file as of 03/28/2021.    Surgical History: Past Surgical History:  Procedure Laterality Date   BIOPSY N/A 03/15/2021   Procedure: BIOPSY;  Surgeon: Lin Landsman, MD;  Location: [place];  Service: Endoscopy;  Laterality: N/A;   BREAST ENHANCEMENT SURGERY     COLONOSCOPY WITH PROPOFOL N/A 03/15/2021   Procedure: COLONOSCOPY WITH PROPOFOL;  Surgeon: Lin Landsman, MD;  Location: [place];  Service: Endoscopy;  Laterality: N/A;   COLPOSCOPY  01/2005   bxs neg   ESOPHAGOGASTRODUODENOSCOPY (EGD) WITH PROPOFOL N/A 03/15/2021   Procedure:  ESOPHAGOGASTRODUODENOSCOPY (EGD) WITH PROPOFOL;  Surgeon: Lin Landsman, MD;  Location: [place];  Service: Endoscopy;  Laterality: N/A;   FOREIGN BODY REMOVAL N/A 03/22/2017   Procedure: FOREIGN BODY REMOVAL;  Surgeon: Malachy Mood, MD;  Location: [facility];  Service: Gynecology;  Laterality: N/A;   INTRAUTERINE DEVICE (IUD) INSERTION  08/2006   Mirena    Medical History: Past Medical History:  Diagnosis Date   ADHD    Dysmetabolic syndrome X [id number]   Genital warts    LGSIL on Pap smear of cervix 2006   Migraine    Ovarian cyst 11/23/2015   Thyroid nodule     Family History: Family History  Problem Relation Age of Onset   Diabetes Mother    Colon cancer Maternal Grandfather 72   Colon cancer Paternal Grandfather     Social History   Socioeconomic History   Marital status: Married    Spouse name: Not on file   Number of children: Not on file   Years of education: Not on file   Highest education level: Not on file  Occupational History   Not on file  Tobacco Use   Smoking status: Never   Smokeless tobacco: Never  Vaping Use   Vaping Use: Never used  Substance and Sexual Activity   Alcohol use: No   Drug use: No   Sexual activity: Yes    Birth control/protection: None    Comment: pt has IUD surgically removed  Other Topics Concern   Not on file  Social History Narrative   Not on file   Social Determinants of Health   Financial Resource Strain:  Not on file  Food Insecurity: Not on file  Transportation Needs: Not on file  Physical Activity: Not on file  Stress: Not on file  Social Connections: Not on file  Intimate Partner Violence: Not on file      Review of Systems  Constitutional:  Negative for chills, diaphoresis and fatigue.  HENT:  Negative for ear pain, postnasal drip and sinus pressure.   Eyes:  Negative for photophobia, discharge, redness, itching and visual disturbance.  Respiratory:  Negative for cough, shortness of  breath and wheezing.   Cardiovascular:  Negative for chest pain, palpitations and leg swelling.  Gastrointestinal:  Negative for abdominal pain, constipation, diarrhea, nausea and vomiting.  Genitourinary:  Negative for dysuria and flank pain.  Musculoskeletal:  Negative for arthralgias, back pain, gait problem and neck pain.  Skin:  Negative for color change.  Allergic/Immunologic: Negative for environmental allergies and food allergies.  Neurological:  Negative for dizziness and headaches.  Hematological:  Does not bruise/bleed easily.  Psychiatric/Behavioral:  Negative for agitation, behavioral problems (depression) and hallucinations.    Vital Signs: BP 101/73    Pulse 63    Temp 98.4 F (36.9 C)    Resp 16    Ht 5\' 4"  (1.626 m)    Wt 137 lb 6.4 oz (62.3 kg)    SpO2 99%    BMI 23.58 kg/m    Physical Exam Vitals and nursing note reviewed.  Constitutional:      General: She is not in acute distress.    Appearance: She is well-developed and normal weight. She is not diaphoretic.  HENT:     Head: Normocephalic and atraumatic.     Mouth/Throat:     Pharynx: No oropharyngeal exudate.  Eyes:     Pupils: Pupils are equal, round, and reactive to light.  Neck:     Thyroid: No thyromegaly.     Vascular: No JVD.     Trachea: No tracheal deviation.  Cardiovascular:     Rate and Rhythm: Normal rate and regular rhythm.     Heart sounds: Normal heart sounds. No murmur heard.   No friction rub. No gallop.  Pulmonary:     Effort: Pulmonary effort is normal. No respiratory distress.     Breath sounds: No wheezing or rales.  Chest:     Chest wall: No tenderness.  Abdominal:     General: Bowel sounds are normal.     Palpations: Abdomen is soft.  Musculoskeletal:        General: Normal range of motion.     Cervical back: Normal range of motion and neck supple.  Lymphadenopathy:     Cervical: No cervical adenopathy.  Skin:    General: Skin is warm and dry.  Neurological:     Mental  Status: She is alert and oriented to person, place, and time.     Cranial Nerves: No cranial nerve deficit.  Psychiatric:        Behavior: Behavior normal.        Thought Content: Thought content normal.        Judgment: Judgment normal.       Assessment/Plan: 1. Attention and concentration deficit May continue Adderall as needed - amphetamine-dextroamphetamine (ADDERALL) 10 MG tablet; Take 1 tablet (10 mg total) by mouth daily as needed.  Dispense: 60 tablet; Refill: 0 Refilled Controlled medications today. Reviewed risks and possible side effects associated with taking Stimulants. Combination of these drugs with other psychotropic medications could cause dizziness and drowsiness. Pt  needs to Monitor symptoms and exercise caution in driving and operating heavy machinery to avoid damages to oneself, to others and to the surroundings. Patient verbalized understanding in this matter. Dependence and abuse for these drugs will be monitored closely. A Controlled substance policy and procedure is on file which allows [facility] to order a urine drug screen test at any visit. Patient understands and agrees with the plan..  2. Abdominal bloating Followed by GI and states she received a recent diagnosis of IBS   General Counseling: Emmarie verbalizes understanding of the findings of todays visit and agrees with plan of treatment. I have discussed any further diagnostic evaluation that may be needed or ordered today. We also reviewed her medications today. she has been encouraged to call the office with any questions or concerns that should arise related to todays visit.    No orders of the defined types were placed in this encounter.   Meds ordered this encounter  Medications   amphetamine-dextroamphetamine (ADDERALL) 10 MG tablet    Sig: Take 1 tablet (10 mg total) by mouth daily as needed.    Dispense:  60 tablet    Refill:  0    This patient was seen by Drema Dallas,  [name] in collaboration with Dr. Clayborn Bigness as a part of collaborative care agreement.   Total time spent:30 Minutes Time spent includes review of chart, medications, test results, and follow up plan with the patient.      Dr Lavera Guise Internal medicine

## 2021-04-24 ENCOUNTER — Encounter: Payer: Self-pay | Admitting: Gastroenterology

## 2021-04-26 MED ORDER — RIFAXIMIN 550 MG PO TABS: 550.0000 mg | ORAL_TABLET | Freq: Three times a day (TID) | ORAL | 0 refills | Status: AC

## 2021-04-26 NOTE — Telephone Encounter (Signed)
Sent medication to the pharmacy  

## 2021-05-30 ENCOUNTER — Ambulatory Visit: Payer: Medicaid Other | Admitting: Gastroenterology

## 2021-06-20 ENCOUNTER — Ambulatory Visit: Payer: Medicaid Other | Admitting: Physician Assistant

## 2021-06-23 ENCOUNTER — Ambulatory Visit: Payer: Medicaid Other | Admitting: Physician Assistant

## 2021-06-24 ENCOUNTER — Encounter: Payer: Self-pay | Admitting: Physician Assistant

## 2021-06-24 ENCOUNTER — Ambulatory Visit: Payer: Medicaid Other | Admitting: Physician Assistant

## 2021-06-24 VITALS — BP 124/60 | HR 92 | Temp 98.3°F | Resp 16 | Ht 64.0 in | Wt 131.6 lb

## 2021-06-24 DIAGNOSIS — R41840 Attention and concentration deficit: Secondary | ICD-10-CM | POA: Diagnosis not present

## 2021-06-24 DIAGNOSIS — M25541 Pain in joints of right hand: Secondary | ICD-10-CM | POA: Diagnosis not present

## 2021-06-24 DIAGNOSIS — R5383 Other fatigue: Secondary | ICD-10-CM

## 2021-06-24 DIAGNOSIS — E559 Vitamin D deficiency, unspecified: Secondary | ICD-10-CM

## 2021-06-24 DIAGNOSIS — E782 Mixed hyperlipidemia: Secondary | ICD-10-CM

## 2021-06-24 DIAGNOSIS — R7989 Other specified abnormal findings of blood chemistry: Secondary | ICD-10-CM | POA: Diagnosis not present

## 2021-06-24 DIAGNOSIS — E538 Deficiency of other specified B group vitamins: Secondary | ICD-10-CM

## 2021-06-24 MED ORDER — AMPHETAMINE-DEXTROAMPHETAMINE 10 MG PO TABS: 10.0000 mg | ORAL_TABLET | Freq: Every day | ORAL | 0 refills | Status: DC | PRN

## 2021-06-24 NOTE — Progress Notes (Signed)
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 She primarily uses her right hand. She does also have some symptoms of tendonitis and carpal tunnel in this hand with some pain and tingling from forearm to hand. May try night splints -She is doing Low fodmap and gluten free diet and reports she feels much better. She has been losing weight with this change, due to limiting her diet options, but feels well  -Still takes adderall only a few days per week as needed. She is due for refill. Denies any palpitations or sleep disturbances on this medication  Current Medication: Outpatient Encounter Medications as of 06/24/2021  Medication Sig   Multiple Vitamins-Minerals (HAIR SKIN AND NAILS FORMULA) TABS Take 3 tablets by mouth daily.   valACYclovir (VALTREX) 1000 MG tablet Take 1 tablet (1,000 mg total) by mouth 2 (two) times daily.   [DISCONTINUED] amphetamine-dextroamphetamine (ADDERALL) 10 MG tablet Take 1 tablet (10 mg total) by mouth daily as needed.   amphetamine-dextroamphetamine (ADDERALL) 10 MG tablet Take 1 tablet (10 mg total) by mouth daily as needed.   No facility-administered encounter medications on file as of 06/24/2021.    Surgical History: Past Surgical History:  Procedure Laterality Date   BIOPSY N/A 03/15/2021   Procedure: BIOPSY;  Surgeon: Toney Reil, MD;  Location[facility];  Service: Endoscopy;  Laterality: N/A;   BREAST ENHANCEMENT  SURGERY     COLONOSCOPY WITH PROPOFOL N/A 03/15/2021   Procedure: COLONOSCOPY WITH PROPOFOL;  Surgeon: Toney Reil, MD;  Location: [facility];  Service: Endoscopy;  Laterality: N/A;   COLPOSCOPY  01/2005   bxs neg   ESOPHAGOGASTRODUODENOSCOPY (EGD) WITH PROPOFOL N/A 03/15/2021   Procedure: ESOPHAGOGASTRODUODENOSCOPY (EGD) WITH PROPOFOL;  Surgeon: Toney Reil, MD;  Location: [facility];  Service: Endoscopy;  Laterality: N/A;   FOREIGN BODY REMOVAL N/A 03/22/2017   Procedure: FOREIGN BODY REMOVAL;  Surgeon: Vena Austria, MD;  Location: [facility];  Service: Gynecology;  Laterality: N/A;   INTRAUTERINE DEVICE (IUD) INSERTION  08/2006   Mirena    Medical History: Past Medical History:  Diagnosis Date   ADHD    Dysmetabolic syndrome X 03/23/2011   Genital warts    LGSIL on Pap smear of cervix 2006   Migraine    Ovarian cyst 11/23/2015   Thyroid nodule     Family History: Family History  Problem Relation Age of Onset   Diabetes Mother    Colon cancer Maternal Grandfather 69   Colon cancer Paternal Grandfather     Social History   Socioeconomic History   Marital status: Married    Spouse name: Not on file   Number of children: Not on file   Years of education: Not on file   Highest education level: Not on file  Occupational History   Not on file  Tobacco Use  Smoking status: Never   Smokeless tobacco: Never  Vaping Use   Vaping Use: Never used  Substance and Sexual Activity   Alcohol use: No   Drug use: No   Sexual activity: Yes    Birth control/protection: None    Comment: pt has IUD surgically removed  Other Topics Concern   Not on file  Social History Narrative   Not on file   Social Determinants of Health   Financial Resource Strain: Not on file  Food Insecurity: Not on file  Transportation Needs: Not on file  Physical Activity: Not on file  Stress: Not on file  Social Connections: Not on file  Intimate Partner  Violence: Not on file      Review of Systems  Constitutional:  Negative for chills, diaphoresis and fatigue.  HENT:  Negative for ear pain, postnasal drip and sinus pressure.   Eyes:  Negative for photophobia, discharge, redness, itching and visual disturbance.  Respiratory:  Negative for cough, shortness of breath and wheezing.   Cardiovascular:  Negative for chest pain, palpitations and leg swelling.  Gastrointestinal:  Negative for abdominal pain, constipation, diarrhea, nausea and vomiting.  Genitourinary:  Negative for dysuria and flank pain.  Musculoskeletal:  Positive for arthralgias. Negative for back pain, gait problem and neck pain.       Knot at based of right middle finger that's tender  Skin:  Negative for color change.  Allergic/Immunologic: Negative for environmental allergies and food allergies.  Neurological:  Negative for dizziness and headaches.  Hematological:  Does not bruise/bleed easily.  Psychiatric/Behavioral:  Negative for agitation, behavioral problems (depression) and hallucinations.    Vital Signs: BP 124/60 Comment: 122/51  Pulse 92   Temp 98.3 F (36.8 C)   Resp 16   Ht  (1.626 m)   Wt 131 lb 9.6 oz (59.7 kg)   SpO2 99%   BMI 22.59 kg/m    Physical Exam Vitals and nursing note reviewed.  Constitutional:      General: She is not in acute distress.    Appearance: She is well-developed and normal weight. She is not diaphoretic.  HENT:     Head: Normocephalic and atraumatic.     Mouth/Throat:     Pharynx: No oropharyngeal exudate.  Eyes:     Pupils: Pupils are equal, round, and reactive to light.  Neck:     Thyroid: No thyromegaly.     Vascular: No JVD.     Trachea: No tracheal deviation.  Cardiovascular:     Rate and Rhythm: Normal rate and regular rhythm.     Heart sounds: Normal heart sounds. No murmur heard.   No friction rub. No gallop.  Pulmonary:     Effort: Pulmonary effort is normal. No respiratory distress.     Breath  sounds: No wheezing or rales.  Chest:     Chest wall: No tenderness.  Abdominal:     General: Bowel sounds are normal.     Palpations: Abdomen is soft.  Musculoskeletal:        General: Tenderness present. Normal range of motion.     Cervical back: Normal range of motion and neck supple.     Comments: Small well circumscribed round nodule at base of right middle finger on palmar side, tender to direct pressure  Lymphadenopathy:     Cervical: No cervical adenopathy.  Skin:    General: Skin is warm and dry.  Neurological:     Mental Status: She is alert and oriented  to person, place, and time.     Cranial Nerves: No cranial nerve deficit.  Psychiatric:        Behavior: Behavior normal.        Thought Content: Thought content normal.        Judgment: Judgment normal.       Assessment/Plan: 1. Attention and concentration deficit May continue Adderall as needed - amphetamine-dextroamphetamine (ADDERALL) 10 MG tablet; Take 1 tablet (10 mg total) by mouth daily as needed.  Dispense: 60 tablet; Refill: 0 [place] Controlled Substance Database was reviewed by me for overdose risk score (ORS) Refilled Controlled medications today. Reviewed risks and possible side effects associated with taking Stimulants. Combination of these drugs with other psychotropic medications could cause dizziness and drowsiness. Pt needs to Monitor symptoms and exercise caution in driving and operating heavy machinery to avoid damages to oneself, to others and to the surroundings. Patient verbalized understanding in this matter. Dependence and abuse for these drugs will be monitored closely. A Controlled substance policy and procedure is on file which allows [facility] to order a urine drug screen test at any visit. Patient understands and agrees with the plan..  2. Arthralgia of right hand Small nodule present at base of right middle finger on palmar side.  Discussed possible ganglion cyst versus nodule from  arthritis.  Patient is going to monitor symptoms and if worsening may need orthopedic referral.  She is concerned about arthritis as her mother has lupus and arthritis.  She previously had an ANA checked which is negative but we will go ahead and order an RF for further evaluation.  Also advised to try nighttime wrist splint as she does have some symptoms consistent with carpal tunnel as well - Rheumatoid Factor  3. Abnormal thyroid blood test - TSH + free T4  4. Mixed hyperlipidemia - Lipid Panel With LDL/HDL Ratio  5. Vitamin D deficiency - VITAMIN D 25 Hydroxy (Vit-D Deficiency, Fractures)  6. B12 deficiency - B12 and Folate Panel  7. Other fatigue - CBC w/Diff/Platelet - Comprehensive metabolic panel - TSH + free T4 - VITAMIN D 25 Hydroxy (Vit-D Deficiency, Fractures) - B12 and Folate Panel - Rheumatoid Factor   General Counseling: Leandrea verbalizes understanding of the findings of todays visit and agrees with plan of treatment. I have discussed any further diagnostic evaluation that may be needed or ordered today. We also reviewed her medications today. she has been encouraged to call the office with any questions or concerns that should arise related to todays visit.    Orders Placed This Encounter  Procedures   CBC w/Diff/Platelet   Comprehensive metabolic panel   TSH + free T4   VITAMIN D 25 Hydroxy (Vit-D Deficiency, Fractures)   B12 and Folate Panel   Rheumatoid Factor   Lipid Panel With LDL/HDL Ratio    Meds ordered this encounter  Medications   amphetamine-dextroamphetamine (ADDERALL) 10 MG tablet    Sig: Take 1 tablet (10 mg total) by mouth daily as needed.    Dispense:  60 tablet    Refill:  0    This patient was seen by Lynn Ito, [name] in collaboration with Dr. Beverely Risen as a part of collaborative care agreement.   Total time spent:30 Minutes Time spent includes review of chart, medications, test results, and follow up plan with the patient.       Dr Lyndon Code Internal medicine

## 2021-07-19 LAB — COMPREHENSIVE METABOLIC PANEL
ALT: 8 IU/L (ref 0–32)
AST: 11 IU/L (ref 0–40)
Albumin/Globulin Ratio: 2.4 — ABNORMAL HIGH (ref 1.2–2.2)
Albumin: 4.7 g/dL (ref 3.8–4.8)
Alkaline Phosphatase: 50 IU/L (ref 44–121)
BUN/Creatinine Ratio: 21 (ref 9–23)
BUN: 17 mg/dL (ref 6–20)
Bilirubin Total: <0.2 mg/dL (ref 0.0–1.2)
CO2: 25 mmol/L (ref 20–29)
Calcium: 9.4 mg/dL (ref 8.7–10.2)
Chloride: 104 mmol/L (ref 96–106)
Creatinine, Ser: 0.8 mg/dL (ref 0.57–1.00)
Globulin, Total: 2 g/dL (ref 1.5–4.5)
Glucose: 88 mg/dL (ref 70–99)
Potassium: 4.1 mmol/L (ref 3.5–5.2)
Sodium: 141 mmol/L (ref 134–144)
Total Protein: 6.7 g/dL (ref 6.0–8.5)
eGFR: 100 mL/min/{1.73_m2} (ref >59)

## 2021-07-19 LAB — TSH+FREE T4
Free T4: 1.05 ng/dL (ref 0.82–1.77)
TSH: 1.16 u[IU]/mL (ref 0.450–4.500)

## 2021-07-19 LAB — CBC WITH DIFFERENTIAL/PLATELET
Basophils Absolute: 0 10*3/uL (ref 0.0–0.2)
Basos: 1 %
EOS (ABSOLUTE): 0.5 10*3/uL — ABNORMAL HIGH (ref 0.0–0.4)
Eos: 10 %
Hematocrit: 37.5 % (ref 34.0–46.6)
Hemoglobin: 12.8 g/dL (ref 11.1–15.9)
Immature Grans (Abs): 0 10*3/uL (ref 0.0–0.1)
Immature Granulocytes: 0 %
Lymphocytes Absolute: 1.3 10*3/uL (ref 0.7–3.1)
Lymphs: 25 %
MCH: 30.8 pg (ref 26.6–33.0)
MCHC: 34.1 g/dL (ref 31.5–35.7)
MCV: 90 fL (ref 79–97)
Monocytes Absolute: 0.4 10*3/uL (ref 0.1–0.9)
Monocytes: 8 %
Neutrophils Absolute: 2.9 10*3/uL (ref 1.4–7.0)
Neutrophils: 56 %
Platelets: 192 10*3/uL (ref 150–450)
RBC: 4.15 x10E6/uL (ref 3.77–5.28)
RDW: 11.8 % (ref 11.7–15.4)
WBC: 5.1 10*3/uL (ref 3.4–10.8)

## 2021-07-19 LAB — B12 AND FOLATE PANEL
Folate: 4.1 ng/mL (ref >3.0)
Vitamin B-12: 297 pg/mL (ref 232–1245)

## 2021-07-19 LAB — LIPID PANEL WITH LDL/HDL RATIO
Cholesterol, Total: 183 mg/dL (ref 100–199)
HDL: 82 mg/dL (ref >39)
LDL Chol Calc (NIH): 75 mg/dL (ref 0–99)
LDL/HDL Ratio: 0.9 ratio (ref 0.0–3.2)
Triglycerides: 158 mg/dL — ABNORMAL HIGH (ref 0–149)
VLDL Cholesterol Cal: 26 mg/dL (ref 5–40)

## 2021-07-19 LAB — VITAMIN D 25 HYDROXY (VIT D DEFICIENCY, FRACTURES): Vit D, 25-Hydroxy: 45.5 ng/mL (ref 30.0–100.0)

## 2021-07-19 LAB — RHEUMATOID FACTOR: Rheumatoid fact SerPl-aCnc: <10 IU/mL (ref <14.0)

## 2021-07-22 ENCOUNTER — Telehealth: Payer: Self-pay

## 2021-07-26 ENCOUNTER — Ambulatory Visit: Payer: Medicaid Other

## 2021-07-26 DIAGNOSIS — E538 Deficiency of other specified B group vitamins: Secondary | ICD-10-CM | POA: Diagnosis not present

## 2021-07-26 MED ORDER — CYANOCOBALAMIN 1000 MCG/ML IJ SOLN
1000.0000 ug | Freq: Once | INTRAMUSCULAR | Status: AC
  Administered 2021-07-26: 1000 ug via INTRAMUSCULAR

## 2021-08-01 ENCOUNTER — Ambulatory Visit: Payer: Medicaid Other

## 2021-08-04 ENCOUNTER — Ambulatory Visit (INDEPENDENT_AMBULATORY_CARE_PROVIDER_SITE_OTHER): Payer: Medicaid Other

## 2021-08-04 DIAGNOSIS — E538 Deficiency of other specified B group vitamins: Secondary | ICD-10-CM

## 2021-08-05 DIAGNOSIS — E538 Deficiency of other specified B group vitamins: Secondary | ICD-10-CM | POA: Diagnosis not present

## 2021-08-05 MED ORDER — CYANOCOBALAMIN 1000 MCG/ML IJ SOLN
1000.0000 ug | Freq: Once | INTRAMUSCULAR | Status: AC
  Administered 2021-08-05: 1000 ug via INTRAMUSCULAR

## 2021-08-08 ENCOUNTER — Ambulatory Visit: Payer: Medicaid Other

## 2021-08-09 ENCOUNTER — Ambulatory Visit: Payer: Medicaid Other

## 2021-08-10 ENCOUNTER — Ambulatory Visit: Payer: Medicaid Other

## 2021-08-10 DIAGNOSIS — E538 Deficiency of other specified B group vitamins: Secondary | ICD-10-CM

## 2021-08-10 MED ORDER — CYANOCOBALAMIN 1000 MCG/ML IJ SOLN
1000.0000 ug | Freq: Once | INTRAMUSCULAR | Status: AC
  Administered 2021-08-10: 1000 ug via INTRAMUSCULAR

## 2021-08-15 ENCOUNTER — Ambulatory Visit: Payer: Medicaid Other

## 2021-09-26 ENCOUNTER — Ambulatory Visit (INDEPENDENT_AMBULATORY_CARE_PROVIDER_SITE_OTHER): Payer: Medicaid Other | Admitting: Physician Assistant

## 2021-09-26 ENCOUNTER — Encounter: Payer: Self-pay | Admitting: Physician Assistant

## 2021-09-26 DIAGNOSIS — Z0001 Encounter for general adult medical examination with abnormal findings: Secondary | ICD-10-CM

## 2021-09-26 DIAGNOSIS — Z01419 Encounter for gynecological examination (general) (routine) without abnormal findings: Secondary | ICD-10-CM

## 2021-09-26 DIAGNOSIS — R300 Dysuria: Secondary | ICD-10-CM | POA: Diagnosis not present

## 2021-09-26 DIAGNOSIS — R41840 Attention and concentration deficit: Secondary | ICD-10-CM | POA: Diagnosis not present

## 2021-09-26 DIAGNOSIS — E538 Deficiency of other specified B group vitamins: Secondary | ICD-10-CM | POA: Diagnosis not present

## 2021-09-26 MED ORDER — AMPHETAMINE-DEXTROAMPHETAMINE 10 MG PO TABS: 10.0000 mg | ORAL_TABLET | Freq: Every day | ORAL | 0 refills | Status: DC | PRN

## 2021-09-26 MED ORDER — CYANOCOBALAMIN 1000 MCG/ML IJ SOLN
1000.0000 ug | Freq: Once | INTRAMUSCULAR | Status: AC
  Administered 2021-09-26: 1000 ug via INTRAMUSCULAR

## 2021-09-26 NOTE — Progress Notes (Signed)
[facility] [place], [place] [address]  Internal MEDICINE  Office Visit Note  Patient Name: Carolyn Edwards  [id number]  [id number]  Date of Service: 10/04/2021  Chief Complaint  Patient presents with   Annual Exam     HPI Pt is here for routine health maintenance examination -labs previously reviewed over the phone showing low b12 and elevated TG -Never noticed fatigued until starting getting b12 and can see a difference even just 1 month since last injection after having them weekly. -Hx of migraines and used to take meds but these never helped. Had an MRI in past and was negative. Now about 1 per monthly. Took goody powder for most recent one, but normally tries to avoid this.  -Tried imitrex before and felt off and as if throat swelling. Has not tried [place] or nurtec and may call if interested in trying one of these but with infrequency is managing for now. -Still taking adderall as needed. -Notes that the knot at the base of her finger resolved and isnt having any pain there anymore -She does report she has occasionally felt a small bump under left breast, but it is not tender and hard to feel and thinks it may just be a calcium deposit overlying the rib. Nothing was appreciated on exam today, however pt does have implants and was advised to contact office if she feels anything as a mammogram could be ordered.  Current Medication: Outpatient Encounter Medications as of 09/26/2021  Medication Sig   Multiple Vitamins-Minerals (HAIR SKIN AND NAILS FORMULA) TABS Take 3 tablets by mouth daily.   valACYclovir (VALTREX) 1000 MG tablet Take 1 tablet (1,000 mg total) by mouth 2 (two) times daily.   [DISCONTINUED] amphetamine-dextroamphetamine (ADDERALL) 10 MG tablet Take 1 tablet (10 mg total) by mouth daily as needed.   amphetamine-dextroamphetamine (ADDERALL) 10 MG tablet Take 1 tablet (10 mg total) by mouth daily as needed.   [EXPIRED] cyanocobalamin  (VITAMIN B12) injection 1,000 mcg    No facility-administered encounter medications on file as of 09/26/2021.    Surgical History: Past Surgical History:  Procedure Laterality Date   BIOPSY N/A 03/15/2021   Procedure: BIOPSY;  Surgeon: Lin Landsman, MD;  Location: [place];  Service: Endoscopy;  Laterality: N/A;   BREAST ENHANCEMENT SURGERY     COLONOSCOPY WITH PROPOFOL N/A 03/15/2021   Procedure: COLONOSCOPY WITH PROPOFOL;  Surgeon: Lin Landsman, MD;  Location: [place];  Service: Endoscopy;  Laterality: N/A;   COLPOSCOPY  01/2005   bxs neg   ESOPHAGOGASTRODUODENOSCOPY (EGD) WITH PROPOFOL N/A 03/15/2021   Procedure: ESOPHAGOGASTRODUODENOSCOPY (EGD) WITH PROPOFOL;  Surgeon: Lin Landsman, MD;  Location: [place];  Service: Endoscopy;  Laterality: N/A;   FOREIGN BODY REMOVAL N/A 03/22/2017   Procedure: FOREIGN BODY REMOVAL;  Surgeon: Malachy Mood, MD;  Location: [facility];  Service: Gynecology;  Laterality: N/A;   INTRAUTERINE DEVICE (IUD) INSERTION  08/2006   Mirena    Medical History: Past Medical History:  Diagnosis Date   ADHD    Dysmetabolic syndrome X 04/88/8916   Genital warts    LGSIL on Pap smear of cervix 2006   Migraine    Ovarian cyst 11/23/2015   Thyroid nodule     Family History: Family History  Problem Relation Age of Onset   Diabetes Mother    Colon cancer Maternal Grandfather 33   Colon cancer Paternal Grandfather       Review of Systems  Constitutional:  Positive for fatigue. Negative for chills and diaphoresis.  HENT:  Negative for ear pain, postnasal drip and sinus pressure.   Eyes:  Negative for photophobia, discharge, redness, itching and visual disturbance.  Respiratory:  Negative for cough, shortness of breath and wheezing.   Cardiovascular:  Negative for chest pain, palpitations and leg swelling.  Gastrointestinal:  Negative for abdominal pain, constipation, diarrhea, nausea and vomiting.   Genitourinary:  Negative for dysuria and flank pain.  Musculoskeletal:  Negative for arthralgias, back pain, gait problem and neck pain.  Skin:  Negative for color change.  Allergic/Immunologic: Negative for environmental allergies and food allergies.  Neurological:  Negative for dizziness and headaches.  Hematological:  Does not bruise/bleed easily.  Psychiatric/Behavioral:  Negative for agitation, behavioral problems (depression) and hallucinations.      Vital Signs: BP 98/72   Pulse 70   Temp 98.4 F (36.9 C)   Resp 16   Ht 5' 4" (1.626 m)   Wt 132 lb 12.8 oz (60.2 kg)   SpO2 98%   BMI 22.80 kg/m    Physical Exam Vitals and nursing note reviewed.  Constitutional:      General: She is not in acute distress.    Appearance: She is well-developed and normal weight. She is not diaphoretic.  HENT:     Head: Normocephalic and atraumatic.     Mouth/Throat:     Pharynx: No oropharyngeal exudate.  Eyes:     Pupils: Pupils are equal, round, and reactive to light.  Neck:     Thyroid: No thyromegaly.     Vascular: No JVD.     Trachea: No tracheal deviation.  Cardiovascular:     Rate and Rhythm: Normal rate and regular rhythm.     Heart sounds: Normal heart sounds. No murmur heard.    No friction rub. No gallop.  Pulmonary:     Effort: Pulmonary effort is normal. No respiratory distress.     Breath sounds: No wheezing or rales.  Chest:     Chest wall: No tenderness.  Breasts:    Right: Normal. No mass.     Left: Normal. No mass.  Abdominal:     General: Bowel sounds are normal.     Palpations: Abdomen is soft.     Tenderness: There is no abdominal tenderness.  Musculoskeletal:        General: Normal range of motion.     Cervical back: Normal range of motion and neck supple.  Lymphadenopathy:     Cervical: No cervical adenopathy.  Skin:    General: Skin is warm and dry.  Neurological:     Mental Status: She is alert and oriented to person, place, and time.      Cranial Nerves: No cranial nerve deficit.  Psychiatric:        Behavior: Behavior normal.        Thought Content: Thought content normal.        Judgment: Judgment normal.      LABS: Recent Results (from the past 2160 hour(s))  CBC w/Diff/Platelet     Status: Abnormal   Collection Time: 07/18/21  3:30 PM  Result Value Ref Range   WBC 5.1 3.4 - 10.8 x10E3/uL   RBC 4.15 3.77 - 5.28 x10E6/uL   Hemoglobin 12.8 11.1 - 15.9 g/dL   Hematocrit 37.5 34.0 - 46.6 %   MCV 90 79 - 97 fL   MCH 30.8 26.6 - 33.0 pg   MCHC 34.1 31.5 - 35.7 g/dL  RDW 11.8 11.7 - 15.4 %   Platelets 192 150 - 450 x10E3/uL   Neutrophils 56 Not Estab. %   Lymphs 25 Not Estab. %   Monocytes 8 Not Estab. %   Eos 10 Not Estab. %   Basos 1 Not Estab. %   Neutrophils Absolute 2.9 1.4 - 7.0 x10E3/uL   Lymphocytes Absolute 1.3 0.7 - 3.1 x10E3/uL   Monocytes Absolute 0.4 0.1 - 0.9 x10E3/uL   EOS (ABSOLUTE) 0.5 (H) 0.0 - 0.4 x10E3/uL   Basophils Absolute 0.0 0.0 - 0.2 x10E3/uL   Immature Granulocytes 0 Not Estab. %   Immature Grans (Abs) 0.0 0.0 - 0.1 x10E3/uL  Comprehensive metabolic panel     Status: Abnormal   Collection Time: 07/18/21  3:30 PM  Result Value Ref Range   Glucose 88 70 - 99 mg/dL   BUN 17 6 - 20 mg/dL   Creatinine, Ser 0.80 0.57 - 1.00 mg/dL   eGFR 100 >59 mL/min/1.73   BUN/Creatinine Ratio 21 9 - 23   Sodium 141 134 - 144 mmol/L   Potassium 4.1 3.5 - 5.2 mmol/L   Chloride 104 96 - 106 mmol/L   CO2 25 20 - 29 mmol/L   Calcium 9.4 8.7 - 10.2 mg/dL   Total Protein 6.7 6.0 - 8.5 g/dL   Albumin 4.7 3.8 - 4.8 g/dL   Globulin, Total 2.0 1.5 - 4.5 g/dL   Albumin/Globulin Ratio 2.4 (H) 1.2 - 2.2   Bilirubin Total <0.2 0.0 - 1.2 mg/dL   Alkaline Phosphatase 50 44 - 121 IU/L   AST 11 0 - 40 IU/L   ALT 8 0 - 32 IU/L  TSH + free T4     Status: None   Collection Time: 07/18/21  3:30 PM  Result Value Ref Range   TSH 1.160 0.450 - 4.500 uIU/mL   Free T4 1.05 0.82 - 1.77 ng/dL  VITAMIN D 25 Hydroxy  (Vit-D Deficiency, Fractures)     Status: None   Collection Time: 07/18/21  3:30 PM  Result Value Ref Range   Vit D, 25-Hydroxy 45.5 30.0 - 100.0 ng/mL    Comment: Vitamin D deficiency has been defined by the Institute of Medicine and an Endocrine Society practice guideline as a level of serum 25-OH vitamin D less than 20 ng/mL (1,2). The Endocrine Society went on to further define vitamin D insufficiency as a level between 21 and 29 ng/mL (2). 1. IOM (Institute of Medicine). 2010. Dietary reference    intakes for calcium and D. [place]: The    [employer]. 2. Holick MF, Binkley [place], Bischoff-Ferrari HA, et al.    Evaluation, treatment, and prevention of vitamin D    deficiency: an Endocrine Society clinical practice    guideline. JCEM. 2011 Jul; 96(7):1911-30.   B12 and Folate Panel     Status: None   Collection Time: 07/18/21  3:30 PM  Result Value Ref Range   Vitamin B-12 297 232 - 1,245 pg/mL   Folate 4.1 >3.0 ng/mL    Comment: A serum folate concentration of less than 3.1 ng/mL is considered to represent clinical deficiency.   Rheumatoid Factor     Status: None   Collection Time: 07/18/21  3:30 PM  Result Value Ref Range   Rhuematoid fact SerPl-aCnc <10.0 <14.0 IU/mL  Lipid Panel With LDL/HDL Ratio     Status: Abnormal   Collection Time: 07/18/21  3:30 PM  Result Value Ref Range   Cholesterol, Total 183 100 -  199 mg/dL   Triglycerides 158 (H) 0 - 149 mg/dL   HDL 82 >39 mg/dL   VLDL Cholesterol Cal 26 5 - 40 mg/dL   LDL Chol Calc (NIH) 75 0 - 99 mg/dL   LDL/HDL Ratio 0.9 0.0 - 3.2 ratio    Comment:                                     LDL/HDL Ratio                                             Men  Women                               1/2 Avg.Risk  1.0    1.5                                   Avg.Risk  3.6    3.2                                2X Avg.Risk  6.2    5.0                                3X Avg.Risk  8.0    6.1   UA/M w/rflx Culture, Routine      Status: None   Collection Time: 09/26/21  3:19 PM   Specimen: Urine   Urine  Result Value Ref Range   Specific Gravity, UA 1.020 1.005 - 1.030   pH, UA 7.0 5.0 - 7.5   Color, UA Yellow Yellow   Appearance Ur Clear Clear   Leukocytes,UA Negative Negative   Protein,UA Negative Negative/Trace   Glucose, UA Negative Negative   Ketones, UA Negative Negative   RBC, UA Negative Negative   Bilirubin, UA Negative Negative   Urobilinogen, Ur 0.2 0.2 - 1.0 mg/dL   Nitrite, UA Negative Negative   Microscopic Examination Comment     Comment: Microscopic follows if indicated.   Microscopic Examination See below:     Comment: Microscopic was indicated and was performed.   Urinalysis Reflex Comment     Comment: This specimen will not reflex to a Urine Culture.  Microscopic Examination     Status: None   Collection Time: 09/26/21  3:19 PM   Urine  Result Value Ref Range   WBC, UA 0-5 0 - 5 /hpf   RBC, Urine 0-2 0 - 2 /hpf   Epithelial Cells (non renal) 0-10 0 - 10 /hpf   Casts None seen None seen /lpf   Bacteria, UA None seen None seen/Few        Assessment/Plan: 1. Encounter for general adult medical examination with abnormal findings CPE performed, routine labs up to date  2. Attention and concentration deficit May continue adderall as needed - amphetamine-dextroamphetamine (ADDERALL) 10 MG tablet; Take 1 tablet (10 mg total) by mouth daily as needed.  Dispense: 30 tablet; Refill: 0  3. B12 deficiency - cyanocobalamin (VITAMIN B12) injection 1,000 mcg  4. Visit for gynecologic examination  Breast exam performed, advised pt to call if any abnormal findings felt again on self exam as a mammogram could be ordered for further evaluation. Sees OBGYN for pap  5. Dysuria - UA/M w/rflx Culture, Routine   General Counseling: Lattie verbalizes understanding of the findings of todays visit and agrees with plan of treatment. I have discussed any further diagnostic evaluation that may be  needed or ordered today. We also reviewed her medications today. she has been encouraged to call the office with any questions or concerns that should arise related to todays visit.    Counseling:    Orders Placed This Encounter  Procedures   Microscopic Examination   UA/M w/rflx Culture, Routine    Meds ordered this encounter  Medications   amphetamine-dextroamphetamine (ADDERALL) 10 MG tablet    Sig: Take 1 tablet (10 mg total) by mouth daily as needed.    Dispense:  30 tablet    Refill:  0   cyanocobalamin (VITAMIN B12) injection 1,000 mcg    This patient was seen by Drema Dallas, [name] in collaboration with Dr. Clayborn Bigness as a part of collaborative care agreement.  Total time spent:35 Minutes  Time spent includes review of chart, medications, test results, and follow up plan with the patient.     Lavera Guise, MD  Internal Medicine

## 2021-09-27 LAB — UA/M W/RFLX CULTURE, ROUTINE
Bilirubin, UA: NEGATIVE
Glucose, UA: NEGATIVE
Ketones, UA: NEGATIVE
Leukocytes,UA: NEGATIVE
Nitrite, UA: NEGATIVE
Protein,UA: NEGATIVE
RBC, UA: NEGATIVE
Specific Gravity, UA: 1.02 (ref 1.005–1.030)
Urobilinogen, Ur: 0.2 mg/dL (ref 0.2–1.0)
pH, UA: 7 (ref 5.0–7.5)

## 2021-09-27 LAB — MICROSCOPIC EXAMINATION
Bacteria, UA: NONE SEEN
Casts: NONE SEEN /lpf

## 2021-10-18 ENCOUNTER — Encounter: Payer: Self-pay | Admitting: Physician Assistant

## 2021-10-20 NOTE — Telephone Encounter (Signed)
Will have it ready for you tomorrow between 2-3 pm

## 2021-12-26 ENCOUNTER — Encounter: Payer: Self-pay | Admitting: Physician Assistant

## 2021-12-26 ENCOUNTER — Ambulatory Visit: Payer: Medicaid Other | Admitting: Physician Assistant

## 2021-12-26 VITALS — BP 110/79 | HR 70 | Temp 98.2°F | Resp 16 | Ht 64.0 in | Wt 135.0 lb

## 2021-12-26 DIAGNOSIS — F32A Depression, unspecified: Secondary | ICD-10-CM | POA: Diagnosis not present

## 2021-12-26 DIAGNOSIS — F419 Anxiety disorder, unspecified: Secondary | ICD-10-CM | POA: Diagnosis not present

## 2021-12-26 DIAGNOSIS — G4709 Other insomnia: Secondary | ICD-10-CM

## 2021-12-26 DIAGNOSIS — R41840 Attention and concentration deficit: Secondary | ICD-10-CM

## 2021-12-26 MED ORDER — SERTRALINE HCL 25 MG PO TABS: 25.0000 mg | ORAL_TABLET | Freq: Every day | ORAL | 3 refills | Status: DC

## 2021-12-26 MED ORDER — AMPHETAMINE-DEXTROAMPHETAMINE 10 MG PO TABS: 10.0000 mg | ORAL_TABLET | Freq: Every day | ORAL | 0 refills | Status: DC | PRN

## 2021-12-26 NOTE — Progress Notes (Signed)
[facility] [address] [place], [place] [address]  Internal MEDICINE  Office Visit Note  Patient Name: Carolyn Edwards  [id number]  [id number]  Date of Service: 12/26/2021  Chief Complaint  Patient presents with   Follow-up    Med refills    HPI Pt is here for routine follow up for med refills -Insurance has termed and thinks she will try to obtain new plan, however will plan to pay out of pocket for now and use goodrx when able. -She continues to take adderall as needed. -Some recent problems with sleep and worsening anxiety. She denies this being due to adderall since she has been on it a very long time and has never been a problem/ side effect -She has a history of panic attacks and anxiety but has been managing pretty well, but lately has been worse. She thinks she has some depression as well though doesn't actually feel down. She states she doesn't understand why she will cry easily every day despite no identifiable problems with marriage or finances or health. She has had some changes at work with letting 2 employees go who were causing problems, but had been there for 3 years. She states it was for the better since they left, but still a change.  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 -She really doesn't want to be on medications, but it is really impacting sleep now and feels like she can cry all the time, therefore she is open to trying something low dose. Does not want anything that will knock her out or make her feel like a zombie/change her personality.  Current Medication: Outpatient Encounter Medications as of 12/26/2021  Medication Sig   Multiple Vitamins-Minerals (HAIR SKIN AND NAILS FORMULA) TABS Take 3 tablets by mouth daily.   sertraline (ZOLOFT) 25 MG tablet Take 1 tablet (25 mg total) by mouth daily.    valACYclovir (VALTREX) 1000 MG tablet Take 1 tablet (1,000 mg total) by mouth 2 (two) times daily.   [DISCONTINUED] amphetamine-dextroamphetamine (ADDERALL) 10 MG tablet Take 1 tablet (10 mg total) by mouth daily as needed.   amphetamine-dextroamphetamine (ADDERALL) 10 MG tablet Take 1 tablet (10 mg total) by mouth daily as needed.   No facility-administered encounter medications on file as of 12/26/2021.    Surgical History: Past Surgical History:  Procedure Laterality Date   BIOPSY N/A 03/15/2021   Procedure: BIOPSY;  Surgeon: Toney Reil, MD;  Location: [facility];  Service: Endoscopy;  Laterality: N/A;   BREAST ENHANCEMENT SURGERY     COLONOSCOPY WITH PROPOFOL N/A 03/15/2021   Procedure: COLONOSCOPY WITH PROPOFOL;  Surgeon: Toney Reil, MD;  Location: [facility];  Service: Endoscopy;  Laterality: N/A;   COLPOSCOPY  01/2005   bxs neg   ESOPHAGOGASTRODUODENOSCOPY (EGD) WITH PROPOFOL N/A 03/15/2021   Procedure: ESOPHAGOGASTRODUODENOSCOPY (EGD) WITH PROPOFOL;  Surgeon: Toney Reil, MD;  Location: [facility];  Service: Endoscopy;  Laterality: N/A;   FOREIGN BODY REMOVAL N/A 03/22/2017   Procedure: FOREIGN BODY REMOVAL;  Surgeon: Vena Austria, MD;  Location: [facility];  Service: Gynecology;  Laterality: N/A;   INTRAUTERINE DEVICE (IUD) INSERTION  08/2006   Mirena    Medical History: Past Medical History:  Diagnosis Date   ADHD    Dysmetabolic syndrome X 03/23/2011   Genital warts  LGSIL on Pap smear of cervix 2006   Migraine    Ovarian cyst 11/23/2015   Thyroid nodule     Family History: Family History  Problem Relation Age of Onset   Diabetes Mother    Colon cancer Maternal Grandfather 51   Colon cancer Paternal Grandfather     Social History   Socioeconomic History   Marital status: Married    Spouse name: Not on file   Number of children: Not on file   Years of education: Not on file   Highest education level:  Not on file  Occupational History   Not on file  Tobacco Use   Smoking status: Never   Smokeless tobacco: Never  Vaping Use   Vaping Use: Never used  Substance and Sexual Activity   Alcohol use: No   Drug use: No   Sexual activity: Yes    Birth control/protection: None    Comment: pt has IUD surgically removed  Other Topics Concern   Not on file  Social History Narrative   Not on file   Social Determinants of Health   Financial Resource Strain: Not on file  Food Insecurity: Not on file  Transportation Needs: Not on file  Physical Activity: Insufficiently Active (01/02/2017)   Exercise Vital Sign    Days of Exercise per Week: 3 days    Minutes of Exercise per Session: 30 min  Stress: No Stress Concern Present (01/02/2017)   [employer] of Occupational Health - Occupational Stress Questionnaire    Feeling of Stress : Only a little  Social Connections: Somewhat Isolated (01/02/2017)   Social Connection and Isolation Panel [NHANES]    Frequency of Communication with Friends and Family: More than three times a week    Frequency of Social Gatherings with Friends and Family: Twice a week    Attends Religious Services: Never    Database administrator or Organizations: No    Attends Banker Meetings: Never    Marital Status: Married  Catering manager Violence: Not At Risk (01/02/2017)   Humiliation, Afraid, Rape, and Kick questionnaire    Fear of Current or Ex-Partner: No    Emotionally Abused: No    Physically Abused: No    Sexually Abused: No      Review of Systems  Constitutional:  Positive for fatigue. Negative for chills and diaphoresis.  HENT:  Negative for ear pain, postnasal drip and sinus pressure.   Eyes:  Negative for photophobia, discharge, redness, itching and visual disturbance.  Respiratory:  Negative for cough, shortness of breath and wheezing.   Cardiovascular:  Negative for chest pain, palpitations and leg swelling.  Gastrointestinal:   Negative for abdominal pain, constipation, diarrhea, nausea and vomiting.  Genitourinary:  Negative for dysuria and flank pain.  Musculoskeletal:  Negative for arthralgias, back pain, gait problem and neck pain.  Skin:  Negative for color change.  Allergic/Immunologic: Negative for environmental allergies and food allergies.  Neurological:  Negative for dizziness and headaches.  Hematological:  Does not bruise/bleed easily.  Psychiatric/Behavioral:  Positive for decreased concentration, dysphoric mood and sleep disturbance. Negative for agitation, behavioral problems (depression), hallucinations, self-injury and suicidal ideas. The patient is nervous/anxious.     Vital Signs: BP 110/79   Pulse 70   Temp 98.2 F (36.8 C)   Resp 16   Ht 5\' 4"  (1.626 m)   Wt 135 lb (61.2 kg)   SpO2 98%   BMI 23.17 kg/m    Physical Exam Vitals and  nursing note reviewed.  Constitutional:      General: She is not in acute distress.    Appearance: She is well-developed and normal weight. She is not diaphoretic.  HENT:     Head: Normocephalic and atraumatic.     Mouth/Throat:     Pharynx: No oropharyngeal exudate.  Eyes:     Pupils: Pupils are equal, round, and reactive to light.  Neck:     Thyroid: No thyromegaly.     Vascular: No JVD.     Trachea: No tracheal deviation.  Cardiovascular:     Rate and Rhythm: Normal rate and regular rhythm.     Heart sounds: Normal heart sounds. No murmur heard.    No friction rub. No gallop.  Pulmonary:     Effort: Pulmonary effort is normal. No respiratory distress.     Breath sounds: No wheezing or rales.  Chest:     Chest wall: No tenderness.  Breasts:    Right: Normal. No mass.     Left: Normal. No mass.  Abdominal:     General: Bowel sounds are normal.     Palpations: Abdomen is soft.  Musculoskeletal:        General: Normal range of motion.     Cervical back: Normal range of motion and neck supple.  Lymphadenopathy:     Cervical: No cervical  adenopathy.  Skin:    General: Skin is warm and dry.  Neurological:     Mental Status: She is alert and oriented to person, place, and time.     Cranial Nerves: No cranial nerve deficit.  Psychiatric:        Behavior: Behavior normal.        Thought Content: Thought content normal.        Judgment: Judgment normal.     Comments: Tearful in office        Assessment/Plan: 1. Anxiety and depression Will start on low dose zoloft and titrate as needed. Advised to call if any problems - sertraline (ZOLOFT) 25 MG tablet; Take 1 tablet (25 mg total) by mouth daily.  Dispense: 30 tablet; Refill: 3  2. Attention and concentration deficit May continue adderall prn as before - amphetamine-dextroamphetamine (ADDERALL) 10 MG tablet; Take 1 tablet (10 mg total) by mouth daily as needed.  Dispense: 30 tablet; Refill: 0 [place] Controlled Substance Database was reviewed by me for overdose risk score (ORS) Refilled Controlled medications today. Reviewed risks and possible side effects associated with taking Stimulants. Combination of these drugs with other psychotropic medications could cause dizziness and drowsiness. Pt needs to Monitor symptoms and exercise caution in driving and operating heavy machinery to avoid damages to oneself, to others and to the surroundings. Patient verbalized understanding in this matter. Dependence and abuse for these drugs will be monitored closely. A Controlled substance policy and procedure is on file which allows [facility] to order a urine drug screen test at any visit. Patient understands and agrees with the plan..  3. Other insomnia Will try active thinking techniques and will start zoloft to help with anxiety which is impacting sleep   General Counseling: Kathie Dikeshley verbalizes understanding of the findings of todays visit and agrees with plan of treatment. I have discussed any further diagnostic evaluation that may be needed or ordered today. We also reviewed  her medications today. she has been encouraged to call the office with any questions or concerns that should arise related to todays visit.    No orders of the  defined types were placed in this encounter.   Meds ordered this encounter  Medications   amphetamine-dextroamphetamine (ADDERALL) 10 MG tablet    Sig: Take 1 tablet (10 mg total) by mouth daily as needed.    Dispense:  30 tablet    Refill:  0   sertraline (ZOLOFT) 25 MG tablet    Sig: Take 1 tablet (25 mg total) by mouth daily.    Dispense:  30 tablet    Refill:  3    This patient was seen by Lynn Ito, [name] in collaboration with Dr. Beverely Risen as a part of collaborative care agreement.   Total time spent:30 Minutes Time spent includes review of chart, medications, test results, and follow up plan with the patient.      Dr Lyndon Code Internal medicine

## 2022-01-16 ENCOUNTER — Encounter: Payer: Self-pay | Admitting: Physician Assistant

## 2022-01-25 ENCOUNTER — Other Ambulatory Visit: Payer: Self-pay

## 2022-01-25 DIAGNOSIS — F419 Anxiety disorder, unspecified: Secondary | ICD-10-CM

## 2022-01-25 MED ORDER — SERTRALINE HCL 25 MG PO TABS: 25.0000 mg | ORAL_TABLET | Freq: Every day | ORAL | 0 refills | Status: DC

## 2022-02-02 ENCOUNTER — Ambulatory Visit: Payer: Medicaid Other | Admitting: Physician Assistant

## 2022-02-03 ENCOUNTER — Encounter: Payer: Self-pay | Admitting: Physician Assistant

## 2022-02-03 ENCOUNTER — Ambulatory Visit: Payer: Medicaid Other | Admitting: Physician Assistant

## 2022-02-03 VITALS — BP 122/82 | HR 70 | Temp 98.6°F | Resp 16 | Ht 64.0 in | Wt 134.6 lb

## 2022-02-03 DIAGNOSIS — R41840 Attention and concentration deficit: Secondary | ICD-10-CM | POA: Diagnosis not present

## 2022-02-03 DIAGNOSIS — G4709 Other insomnia: Secondary | ICD-10-CM

## 2022-02-03 DIAGNOSIS — F32A Depression, unspecified: Secondary | ICD-10-CM

## 2022-02-03 DIAGNOSIS — F419 Anxiety disorder, unspecified: Secondary | ICD-10-CM

## 2022-02-03 MED ORDER — SERTRALINE HCL 25 MG PO TABS: 25.0000 mg | ORAL_TABLET | Freq: Every day | ORAL | 5 refills | Status: DC

## 2022-02-03 MED ORDER — AMPHETAMINE-DEXTROAMPHETAMINE 10 MG PO TABS: 10.0000 mg | ORAL_TABLET | Freq: Every day | ORAL | 0 refills | Status: DC | PRN

## 2022-02-03 NOTE — Progress Notes (Signed)
[facility] [place], [place] [address]  Internal MEDICINE  Office Visit Note  Patient Name: Carolyn Edwards  [id number]  [id number]  Date of Service: 02/08/2022  Chief Complaint  Patient presents with   Follow-up   ADHD    HPI Pt is here for routine follow up -Anxiety is better since starting zoloft, not crying anymore. Does mention she thought she was having an adverse reaction when she first took zoloft with feeling like she was having some throat and chest tightening, but thinks this was actually due to anxiety and was able to take deep breaths and resolved. She states she hasn't had this problem taking the medication since then.  Sleep did not really improve. Taking zoloft at night and hasn't helped this, but also not making it worse. Could try switching to AM. Will try active thinking techniques and working on sleep hygiene. -Currently having a migraine which is likely why BP was up initially and didn't sleep well last night. Hasn't been having migraines very often anymore. Will give her Ubrelvy samples to try as she had allergic reaction to imitrex previously -Did get bit by something during her sleep a little while ago and left several bug bites along both legs. She has pictures of what they looked like initially vs this morning. These are improving. She is using hydrocortisone cream as needed and may use benadryl as needed  Current Medication: Outpatient Encounter Medications as of 02/03/2022  Medication Sig   Multiple Vitamins-Minerals (HAIR SKIN AND NAILS FORMULA) TABS Take 3 tablets by mouth daily.   valACYclovir (VALTREX) 1000 MG tablet Take 1 tablet (1,000 mg total) by mouth 2 (two) times daily.   [DISCONTINUED] amphetamine-dextroamphetamine (ADDERALL) 10 MG tablet Take 1 tablet (10 mg total) by mouth daily as needed.   [DISCONTINUED] sertraline (ZOLOFT) 25 MG tablet Take 1 tablet (25 mg total) by mouth daily.   amphetamine-dextroamphetamine  (ADDERALL) 10 MG tablet Take 1 tablet (10 mg total) by mouth daily as needed.   sertraline (ZOLOFT) 25 MG tablet Take 1 tablet (25 mg total) by mouth daily.   No facility-administered encounter medications on file as of 02/03/2022.    Surgical History: Past Surgical History:  Procedure Laterality Date   BIOPSY N/A 03/15/2021   Procedure: BIOPSY;  Surgeon: Lin Landsman, MD;  Location: [place];  Service: Endoscopy;  Laterality: N/A;   BREAST ENHANCEMENT SURGERY     COLONOSCOPY WITH PROPOFOL N/A 03/15/2021   Procedure: COLONOSCOPY WITH PROPOFOL;  Surgeon: Lin Landsman, MD;  Location: [place];  Service: Endoscopy;  Laterality: N/A;   COLPOSCOPY  01/2005   bxs neg   ESOPHAGOGASTRODUODENOSCOPY (EGD) WITH PROPOFOL N/A 03/15/2021   Procedure: ESOPHAGOGASTRODUODENOSCOPY (EGD) WITH PROPOFOL;  Surgeon: Lin Landsman, MD;  Location: [place];  Service: Endoscopy;  Laterality: N/A;   FOREIGN BODY REMOVAL N/A 03/22/2017   Procedure: FOREIGN BODY REMOVAL;  Surgeon: Malachy Mood, MD;  Location: [facility];  Service: Gynecology;  Laterality: N/A;   INTRAUTERINE DEVICE (IUD) INSERTION  08/2006   Mirena    Medical History: Past Medical History:  Diagnosis Date   ADHD    Dysmetabolic syndrome X 28/36/6294   Genital warts    LGSIL on Pap smear of cervix 2006   Migraine    Ovarian cyst 11/23/2015   Thyroid nodule     Family History: Family History  Problem Relation Age of Onset   Diabetes Mother    Colon cancer Maternal Grandfather  32   Colon cancer Paternal Grandfather     Social History   Socioeconomic History   Marital status: Married    Spouse name: Not on file   Number of children: Not on file   Years of education: Not on file   Highest education level: Not on file  Occupational History   Not on file  Tobacco Use   Smoking status: Never   Smokeless tobacco: Never  Vaping Use   Vaping Use: Never used  Substance and Sexual  Activity   Alcohol use: No   Drug use: No   Sexual activity: Yes    Birth control/protection: None    Comment: pt has IUD surgically removed  Other Topics Concern   Not on file  Social History Narrative   Not on file   Social Determinants of Health   Financial Resource Strain: Not on file  Food Insecurity: Not on file  Transportation Needs: Not on file  Physical Activity: Insufficiently Active (01/02/2017)   Exercise Vital Sign    Days of Exercise per Week: 3 days    Minutes of Exercise per Session: 30 min  Stress: No Stress Concern Present (01/02/2017)   [place]    Feeling of Stress : Only a little  Social Connections: Somewhat Isolated (01/02/2017)   Social Connection and Isolation Panel [NHANES]    Frequency of Communication with Friends and Family: More than three times a week    Frequency of Social Gatherings with Friends and Family: Twice a week    Attends Religious Services: Never    Marine scientist or Organizations: No    Attends Archivist Meetings: Never    Marital Status: Married  Human resources officer Violence: Not At Risk (01/02/2017)   Humiliation, Afraid, Rape, and Kick questionnaire    Fear of Current or Ex-Partner: No    Emotionally Abused: No    Physically Abused: No    Sexually Abused: No      Review of Systems  Constitutional:  Positive for fatigue. Negative for chills and diaphoresis.  HENT:  Negative for ear pain, postnasal drip and sinus pressure.   Eyes:  Negative for photophobia, discharge, redness, itching and visual disturbance.  Respiratory:  Negative for cough, shortness of breath and wheezing.   Cardiovascular:  Negative for chest pain, palpitations and leg swelling.  Gastrointestinal:  Negative for abdominal pain, constipation, diarrhea, nausea and vomiting.  Genitourinary:  Negative for dysuria and flank pain.  Musculoskeletal:  Negative for arthralgias, back  pain, gait problem and neck pain.  Skin:  Positive for rash. Negative for color change.       Bug bites on legs that are improving  Allergic/Immunologic: Negative for environmental allergies and food allergies.  Neurological:  Positive for headaches. Negative for dizziness.  Hematological:  Does not bruise/bleed easily.  Psychiatric/Behavioral:  Positive for decreased concentration and sleep disturbance. Negative for agitation, behavioral problems (depression), hallucinations, self-injury and suicidal ideas. The patient is nervous/anxious.     Vital Signs: BP 122/82 Comment: 150/116  Pulse 70   Temp 98.6 F (37 C)   Resp 16   Ht 5\' 4"  (1.626 m)   Wt 134 lb 9.6 oz (61.1 kg)   SpO2 98%   BMI 23.10 kg/m    Physical Exam Vitals and nursing note reviewed.  Constitutional:      General: She is not in acute distress.    Appearance: She is well-developed and normal weight.  She is not diaphoretic.  HENT:     Head: Normocephalic and atraumatic.     Mouth/Throat:     Pharynx: No oropharyngeal exudate.  Eyes:     Pupils: Pupils are equal, round, and reactive to light.  Neck:     Thyroid: No thyromegaly.     Vascular: No JVD.     Trachea: No tracheal deviation.  Cardiovascular:     Rate and Rhythm: Normal rate and regular rhythm.     Heart sounds: Normal heart sounds. No murmur heard.    No friction rub. No gallop.  Pulmonary:     Effort: Pulmonary effort is normal. No respiratory distress.     Breath sounds: No wheezing or rales.  Chest:     Chest wall: No tenderness.  Breasts:    Right: Normal. No mass.     Left: Normal. No mass.  Abdominal:     General: Bowel sounds are normal.     Palpations: Abdomen is soft.     Tenderness: There is no abdominal tenderness.  Musculoskeletal:        General: Normal range of motion.     Cervical back: Normal range of motion and neck supple.  Lymphadenopathy:     Cervical: No cervical adenopathy.  Skin:    General: Skin is warm and  dry.  Neurological:     Mental Status: She is alert and oriented to person, place, and time.     Cranial Nerves: No cranial nerve deficit.  Psychiatric:        Behavior: Behavior normal.        Thought Content: Thought content normal.        Judgment: Judgment normal.        Assessment/Plan: 1. Anxiety and depression Improving, continue zoloft as before - sertraline (ZOLOFT) 25 MG tablet; Take 1 tablet (25 mg total) by mouth daily.  Dispense: 30 tablet; Refill: 5  2. Attention and concentration deficit May continue adderall as needed as before - amphetamine-dextroamphetamine (ADDERALL) 10 MG tablet; Take 1 tablet (10 mg total) by mouth daily as needed.  Dispense: 60 tablet; Refill: 0  3. Other insomnia Will work on active thinking techniques and improving sleep hygiene. May try melatonin and moving zoloft to AM to see if this helps.   General Counseling: virgil slinger understanding of the findings of todays visit and agrees with plan of treatment. I have discussed any further diagnostic evaluation that may be needed or ordered today. We also reviewed her medications today. she has been encouraged to call the office with any questions or concerns that should arise related to todays visit.    No orders of the defined types were placed in this encounter.   Meds ordered this encounter  Medications   sertraline (ZOLOFT) 25 MG tablet    Sig: Take 1 tablet (25 mg total) by mouth daily.    Dispense:  30 tablet    Refill:  5   amphetamine-dextroamphetamine (ADDERALL) 10 MG tablet    Sig: Take 1 tablet (10 mg total) by mouth daily as needed.    Dispense:  60 tablet    Refill:  0    This patient was seen by Drema Dallas, [name] in collaboration with Dr. Clayborn Bigness as a part of collaborative care agreement.   Total time spent:30 Minutes Time spent includes review of chart, medications, test results, and follow up plan with the patient.      Dr Lavera Guise Internal  medicine

## 2022-02-26 ENCOUNTER — Other Ambulatory Visit: Payer: Self-pay | Admitting: Physician Assistant

## 2022-02-26 DIAGNOSIS — F419 Anxiety disorder, unspecified: Secondary | ICD-10-CM

## 2022-05-10 NOTE — Progress Notes (Unsigned)
PCP:  Carlean Jews, [name]   Chief Complaint  Patient presents with   Gynecologic Exam   Contraception    Interested in IUD     HPI:      Carolyn Edwards is a 37 y.o. G1P1001 whose LMP was Patient's last menstrual period was 05/07/2022 (approximate)., presents today for her annual examination.  Her menses are regular every 27-28 days, lasting 4 days, light flow/clots mostly, no BTB.  Dysmenorrhea moderate, improved with heating pad/NSAIDs.   Sex activity: single partner, contraception - none. No pain/bleeding. Tried to conceive again for 5 yrs but would like BC now. Did Mirena twice in past and would like again.   Last Pap: 11/27/19 Results were: ASCUS with NEGATIVE high risk HPV ; hx of LGSIL in 2006 Hx of STDs: chlamydia, HPV  There is no FH of breast cancer. There is no FH of ovarian cancer. The patient does self-breast exams. Has felt a mass RT breast with lying down the past few months. Sometimes harder to feel.  Tobacco use: The patient denies current or previous tobacco use. Alcohol use: none No drug use.  Exercise: moderately active  She does get adequate calcium but not Vitamin D in her diet.  Pt with issues of progressive bloating/swelling, with loose stools several times daily, occas blood in stool due to frequency, and feeling "gas-y". Pt saw GI, doing stool samples and had neg colonoscopy 03/15/21. Pt doing low FODMAP and gluten free diet and much improved. FH colon cancer in grandfathers.   Patient Active Problem List   Diagnosis Date Noted   Chronic diarrhea of unknown origin    Abdominal bloating    Other fatigue 10/25/2017   Attention and concentration deficit 10/25/2017    Past Surgical History:  Procedure Laterality Date   BIOPSY N/A 03/15/2021   Procedure: BIOPSY;  Surgeon: Toney Reil, MD;  [facility];  Service: Endoscopy;  Laterality: N/A;   BREAST ENHANCEMENT SURGERY     COLONOSCOPY WITH PROPOFOL N/A  03/15/2021   Procedure: COLONOSCOPY WITH PROPOFOL;  Surgeon: Toney Reil, MD;  Location: [facility];  Service: Endoscopy;  Laterality: N/A;   COLPOSCOPY  01/2005   bxs neg   ESOPHAGOGASTRODUODENOSCOPY (EGD) WITH PROPOFOL N/A 03/15/2021   Procedure: ESOPHAGOGASTRODUODENOSCOPY (EGD) WITH PROPOFOL;  Surgeon: Toney Reil, MD;  Location: [facility];  Service: Endoscopy;  Laterality: N/A;   FOREIGN BODY REMOVAL N/A 03/22/2017   Procedure: FOREIGN BODY REMOVAL;  Surgeon: Vena Austria, MD;  Location: [facility];  Service: Gynecology;  Laterality: N/A;   INTRAUTERINE DEVICE (IUD) INSERTION  08/2006   Mirena    Family History  Problem Relation Age of Onset   Diabetes Mother    Colon cancer Maternal Grandfather 25   Lung cancer Paternal Grandfather     Social History   Socioeconomic History   Marital status: Married    Spouse name: Not on file   Number of children: Not on file   Years of education: Not on file   Highest education level: Not on file  Occupational History   Not on file  Tobacco Use   Smoking status: Never   Smokeless tobacco: Never  Vaping Use   Vaping Use: Never used  Substance and Sexual Activity   Alcohol use: No   Drug use: No   Sexual activity: Yes    Birth control/protection: None  Other Topics Concern   Not on file  Social History Narrative  Not on file   Social Determinants of Health   Financial Resource Strain: Not on file  Food Insecurity: Not on file  Transportation Needs: Not on file  Physical Activity: Insufficiently Active (01/02/2017)   Exercise Vital Sign    Days of Exercise per Week: 3 days    Minutes of Exercise per Session: 30 min  Stress: No Stress Concern Present (01/02/2017)   [employer] of Occupational Health - Occupational Stress Questionnaire    Feeling of Stress : Only a little  Social Connections: Somewhat Isolated (01/02/2017)   Social Connection and Isolation Panel [NHANES]    Frequency  of Communication with Friends and Family: More than three times a week    Frequency of Social Gatherings with Friends and Family: Twice a week    Attends Religious Services: Never    Database administrator or Organizations: No    Attends Banker Meetings: Never    Marital Status: Married  Catering manager Violence: Not At Risk (01/02/2017)   Humiliation, Afraid, Rape, and Kick questionnaire    Fear of Current or Ex-Partner: No    Emotionally Abused: No    Physically Abused: No    Sexually Abused: No     Current Outpatient Medications:    amphetamine-dextroamphetamine (ADDERALL) 10 MG tablet, Take 1 tablet (10 mg total) by mouth daily as needed., Disp: 60 tablet, Rfl: 0   levonorgestrel (MIRENA) 20 MCG/DAY IUD, 1 each by Intrauterine route once., Disp: , Rfl:    Multiple Vitamins-Minerals (HAIR SKIN AND NAILS FORMULA) TABS, Take 3 tablets by mouth daily., Disp: , Rfl:    sertraline (ZOLOFT) 25 MG tablet, TAKE 1 TABLET (25 MG TOTAL) BY MOUTH DAILY., Disp: 30 tablet, Rfl: 5   valACYclovir (VALTREX) 1000 MG tablet, Take 1 tablet (1,000 mg total) by mouth 2 (two) times daily., Disp: 60 tablet, Rfl: 1     ROS:  Review of Systems  Constitutional:  Negative for fatigue, fever and unexpected weight change.  Respiratory:  Negative for cough, shortness of breath and wheezing.   Cardiovascular:  Negative for chest pain, palpitations and leg swelling.  Gastrointestinal:  Negative for abdominal distention, blood in stool, constipation, diarrhea, nausea and vomiting.  Endocrine: Negative for cold intolerance, heat intolerance and polyuria.  Genitourinary:  Negative for dyspareunia, dysuria, flank pain, frequency, genital sores, hematuria, menstrual problem, pelvic pain, urgency, vaginal bleeding, vaginal discharge and vaginal pain.  Musculoskeletal:  Negative for back pain, joint swelling and myalgias.  Skin:  Negative for rash.  Neurological:  Negative for dizziness, syncope,  light-headedness, numbness and headaches.  Hematological:  Negative for adenopathy.  Psychiatric/Behavioral:  Negative for agitation, confusion, sleep disturbance and suicidal ideas. The patient is not nervous/anxious.    BREAST: No symptoms   Objective: BP 100/60   Ht 5\' 4"  (1.626 m)   Wt 136 lb (61.7 kg)   LMP 05/07/2022 (Approximate)   BMI 23.34 kg/m    Physical Exam Constitutional:      Appearance: She is well-developed.  Genitourinary:     Vulva normal.     Right Labia: No rash, tenderness or lesions.    Left Labia: No tenderness, lesions or rash.    No vaginal discharge, erythema or tenderness.      Right Adnexa: not tender and no mass present.    Left Adnexa: not tender and no mass present.    No cervical friability or polyp.     Uterus is not enlarged or tender.  Breasts:  Right: No mass, nipple discharge, skin change or tenderness.     Left: No mass, nipple discharge, skin change or tenderness.  Neck:     Thyroid: No thyromegaly.  Cardiovascular:     Rate and Rhythm: Normal rate and regular rhythm.     Heart sounds: Normal heart sounds. No murmur heard. Pulmonary:     Effort: Pulmonary effort is normal.     Breath sounds: Normal breath sounds.  Chest:    Abdominal:     Palpations: Abdomen is soft.     Tenderness: There is no abdominal tenderness. There is no guarding or rebound.  Musculoskeletal:        General: Normal range of motion.     Cervical back: Normal range of motion.  Lymphadenopathy:     Cervical: No cervical adenopathy.  Neurological:     General: No focal deficit present.     Mental Status: She is alert and oriented to person, place, and time.     Cranial Nerves: No cranial nerve deficit.  Skin:    General: Skin is warm and dry.  Psychiatric:        Mood and Affect: Mood normal.        Behavior: Behavior normal.        Thought Content: Thought content normal.        Judgment: Judgment normal.  Vitals reviewed.     IUD  Insertion Procedure Note Patient identified, informed consent performed, consent signed.   Discussed risks of irregular bleeding, cramping, infection, malpositioning or misplacement of the IUD outside the uterus which may require further procedure such as laparoscopy, risk of failure <1%. Time out was performed.   Speculum placed in the vagina.  Cervix visualized.  Cleaned with Betadine x 2.  Grasped anteriorly with a single tooth tenaculum.  Uterus sounded to 7.0 cm.   IUD placed per manufacturer's recommendations.  Strings trimmed to 3 cm. Tenaculum was removed, good hemostasis noted.  Patient tolerated procedure well.   Assessment/Plan: Encounter for annual routine gynecological examination  Cervical cancer screening - Plan: Cytology - PAP  Screening for HPV (human papillomavirus) - Plan: Cytology - PAP  Encounter for initial prescription of intrauterine contraceptive device (IUD) - Plan: levonorgestrel (MIRENA) 20 MCG/DAY IUD 1 each; Mirena pros/cons discussed. Pt elects to insert today.   Encounter for IUD insertion--Patient was given post-procedure instructions.  She was advised to have backup contraception for one week.   Call if you are having increasing pain, cramps or bleeding or if you have a fever greater than 100.4 degrees F., shaking chills, nausea or vomiting. Patient was also asked to check IUD strings periodically and follow up in 4 weeks for IUD check.  Meds ordered this encounter  Medications   levonorgestrel (MIRENA) 20 MCG/DAY IUD 1 each     GYN counsel adequate intake of calcium and vitamin D, diet and exercise     F/U  Return in about 4 weeks (around 06/08/2022) for IUD f/u./ 1 yr annual  Stace Peace B. Lavalle Skoda, [name] 05/11/2022 9:47 AM

## 2022-05-11 ENCOUNTER — Other Ambulatory Visit (HOSPITAL_COMMUNITY)
Admission: RE | Admit: 2022-05-11 | Discharge: 2022-05-11 | Disposition: A | Discharge status: Home / Self Care | Payer: Medicaid Other | Source: Ambulatory Visit | Attending: Obstetrics and Gynecology | Admitting: Obstetrics and Gynecology

## 2022-05-11 ENCOUNTER — Ambulatory Visit: Payer: Medicaid Other | Admitting: Obstetrics and Gynecology

## 2022-05-11 ENCOUNTER — Encounter: Payer: Self-pay | Admitting: Obstetrics and Gynecology

## 2022-05-11 VITALS — BP 100/60 | Ht 64.0 in | Wt 136.0 lb

## 2022-05-11 DIAGNOSIS — Z124 Encounter for screening for malignant neoplasm of cervix: Secondary | ICD-10-CM | POA: Insufficient documentation

## 2022-05-11 DIAGNOSIS — Z01419 Encounter for gynecological examination (general) (routine) without abnormal findings: Secondary | ICD-10-CM | POA: Diagnosis not present

## 2022-05-11 DIAGNOSIS — Z1151 Encounter for screening for human papillomavirus (HPV): Secondary | ICD-10-CM

## 2022-05-11 DIAGNOSIS — Z30014 Encounter for initial prescription of intrauterine contraceptive device: Secondary | ICD-10-CM

## 2022-05-11 DIAGNOSIS — Z30430 Encounter for insertion of intrauterine contraceptive device: Secondary | ICD-10-CM

## 2022-05-11 MED ORDER — LEVONORGESTREL 20 MCG/DAY IU IUD
1.0000 | INTRAUTERINE_SYSTEM | Freq: Once | INTRAUTERINE | Status: AC
  Administered 2022-05-11: 1 via INTRAUTERINE

## 2022-05-11 NOTE — Patient Instructions (Signed)
I value your feedback and you entrusting us with your care. If you get a [facility] patient survey, I would appreciate you taking the time to let us know about your experience today. Thank you! ? ? ?

## 2022-05-12 ENCOUNTER — Encounter: Payer: Self-pay | Admitting: Obstetrics and Gynecology

## 2022-05-16 LAB — CYTOLOGY - PAP
Comment: NEGATIVE
Diagnosis: UNDETERMINED — AB
High risk HPV: NEGATIVE

## 2022-05-26 ENCOUNTER — Telehealth: Payer: Medicaid Other | Admitting: Emergency Medicine

## 2022-05-26 DIAGNOSIS — J329 Chronic sinusitis, unspecified: Secondary | ICD-10-CM | POA: Diagnosis not present

## 2022-05-26 MED ORDER — AMOXICILLIN-POT CLAVULANATE 875-125 MG PO TABS: 1.0000 | ORAL_TABLET | Freq: Two times a day (BID) | ORAL | 0 refills | Status: DC

## 2022-05-26 NOTE — Patient Instructions (Signed)
  [name], thank you for joining [name], [name] for today's virtual visit.  While this provider is not your primary care provider (PCP), if your PCP is located in our provider database this encounter information will be shared with them immediately following your visit.   A [facility] MyChart account gives you access to today's visit and all your visits, tests, and labs performed at [facility] " click here if you don't have a [facility] MyChart account or go to [email or web address]  Consent: (Patient) Carolyn Edwards provided verbal consent for this virtual visit at the beginning of the encounter.  Current Medications:  Current Outpatient Medications:    amoxicillin-clavulanate (AUGMENTIN) 875-125 MG tablet, Take 1 tablet by mouth every 12 (twelve) hours., Disp: 14 tablet, Rfl: 0   amphetamine-dextroamphetamine (ADDERALL) 10 MG tablet, Take 1 tablet (10 mg total) by mouth daily as needed., Disp: 60 tablet, Rfl: 0   levonorgestrel (MIRENA) 20 MCG/DAY IUD, 1 each by Intrauterine route once., Disp: , Rfl:    Multiple Vitamins-Minerals (HAIR SKIN AND NAILS FORMULA) TABS, Take 3 tablets by mouth daily., Disp: , Rfl:    sertraline (ZOLOFT) 25 MG tablet, TAKE 1 TABLET (25 MG TOTAL) BY MOUTH DAILY., Disp: 30 tablet, Rfl: 5   valACYclovir (VALTREX) 1000 MG tablet, Take 1 tablet (1,000 mg total) by mouth 2 (two) times daily., Disp: 60 tablet, Rfl: 1   Medications ordered in this encounter:  Meds ordered this encounter  Medications   amoxicillin-clavulanate (AUGMENTIN) 875-125 MG tablet    Sig: Take 1 tablet by mouth every 12 (twelve) hours.    Dispense:  14 tablet    Refill:  0    Order Specific Question:   Supervising Provider    Answer:   [name]     *If you need refills on other medications prior to your next appointment, please contact your pharmacy*  Follow-Up: Call back or seek an in-person evaluation if the symptoms worsen  or if the condition fails to improve as anticipated.  [facility] [phone]  Other Instructions Take antibiotics as directed.  Please take the full course.   If you have been instructed to have an in-person evaluation today at a local Urgent Care facility, please use the link below. It will take you to a list of all of our available [facility], including address, phone number and hours of operation. Please do not delay care.  [facility]  If you or a family member do not have a primary care provider, use the link below to schedule a visit and establish care. When you choose a [facility] primary care physician or advanced practice provider, you gain a long-term partner in health. Find a Primary Care Provider  Learn more about [facility]'s in-office and virtual care options: [facility] - Get Care Now

## 2022-05-26 NOTE — Progress Notes (Signed)
Virtual Visit Consent   Carolyn Edwards, you are scheduled for a virtual visit with a [facility] provider today. Just as with appointments in the office, your consent must be obtained to participate. Your consent will be active for this visit and any virtual visit you may have with one of our providers in the next 365 days. If you have a MyChart account, a copy of this consent can be sent to you electronically.  As this is a virtual visit, video technology does not allow for your provider to perform a traditional examination. This may limit your provider's ability to fully assess your condition. If your provider identifies any concerns that need to be evaluated in person or the need to arrange testing (such as labs, EKG, etc.), we will make arrangements to do so. Although advances in technology are sophisticated, we cannot ensure that it will always work on either your end or our end. If the connection with a video visit is poor, the visit may have to be switched to a telephone visit. With either a video or telephone visit, we are not always able to ensure that we have a secure connection.  By engaging in this virtual visit, you consent to the provision of healthcare and authorize for your insurance to be billed (if applicable) for the services provided during this visit. Depending on your insurance coverage, you may receive a charge related to this service.  I need to obtain your verbal consent now. Are you willing to proceed with your visit today? Hartley Wyke has provided verbal consent on 05/26/2022 for a virtual visit (video or telephone). [name], [name]  Date: 05/26/2022 10:02 AM  Virtual Visit via Video Note   I, [name], connected with  Carolyn Edwards  ([id number], [birth year]) on 05/26/22 at 10:00 AM EDT by a video-enabled telemedicine application and verified that I am speaking with the correct person using two identifiers.  Location: Patient: Virtual  Visit Location Patient: Home Provider: Virtual Visit Location Provider: Home Office   I discussed the limitations of evaluation and management by telemedicine and the availability of in person appointments. The patient expressed understanding and agreed to proceed.    History of Present Illness: Carolyn Edwards is a 37 y.o. who identifies as a female who was assigned female at birth, and is being seen today for sinus congestion.  Onset of illness was 1 week ago.  Reports thick green discharge.  States that she has sinus pressure.  Denies pregnancy or breastfeeding.  States that she took a couple doses of left over amoxicillin and began to improve, but is now worsening after she ran out of the amoxicillin.  HPI: HPI  Problems:  Patient Active Problem List   Diagnosis Date Noted   Chronic diarrhea of unknown origin    Abdominal bloating    Other fatigue 10/25/2017   Attention and concentration deficit 10/25/2017    Allergies:  Allergies  Allergen Reactions   Imitrex [Sumatriptan] Anaphylaxis   Medications:  Current Outpatient Medications:    amoxicillin-clavulanate (AUGMENTIN) 875-125 MG tablet, Take 1 tablet by mouth every 12 (twelve) hours., Disp: 14 tablet, Rfl: 0   amphetamine-dextroamphetamine (ADDERALL) 10 MG tablet, Take 1 tablet (10 mg total) by mouth daily as needed., Disp: 60 tablet, Rfl: 0   levonorgestrel (MIRENA) 20 MCG/DAY IUD, 1 each by Intrauterine route once., Disp: , Rfl:    Multiple Vitamins-Minerals (HAIR SKIN AND NAILS FORMULA) TABS, Take 3 tablets by mouth daily., Disp: ,  Rfl:    sertraline (ZOLOFT) 25 MG tablet, TAKE 1 TABLET (25 MG TOTAL) BY MOUTH DAILY., Disp: 30 tablet, Rfl: 5   valACYclovir (VALTREX) 1000 MG tablet, Take 1 tablet (1,000 mg total) by mouth 2 (two) times daily., Disp: 60 tablet, Rfl: 1  Observations/Objective: Patient is well-developed, well-nourished in no acute distress.  Resting comfortably at home.  Head is normocephalic, atraumatic.   No labored breathing.  Speech is clear and coherent with logical content.  Patient is alert and oriented at baseline.    Assessment and Plan: 1. Sinusitis, unspecified chronicity, unspecified location   Meds ordered this encounter  Medications   amoxicillin-clavulanate (AUGMENTIN) 875-125 MG tablet    Sig: Take 1 tablet by mouth every 12 (twelve) hours.    Dispense:  14 tablet    Refill:  0    Order Specific Question:   Supervising Provider    Answer:   [name]   Discussed completing antibiotics and not self-medicating with antibiotics without healthcare provider instructions.   Follow Up Instructions: I discussed the assessment and treatment plan with the patient. The patient was provided an opportunity to ask questions and all were answered. The patient agreed with the plan and demonstrated an understanding of the instructions.  A copy of instructions were sent to the patient via MyChart unless otherwise noted below.    The patient was advised to call back or seek an in-person evaluation if the symptoms worsen or if the condition fails to improve as anticipated.  Time:  I spent 11 minutes with the patient via telehealth technology discussing the above problems/concerns.    [name], [name]

## 2022-06-02 ENCOUNTER — Ambulatory Visit: Payer: Medicaid Other | Admitting: Physician Assistant

## 2022-06-07 NOTE — Progress Notes (Unsigned)
   No chief complaint on file.    History of Present Illness:  Carolyn Edwards is a 37 y.o. that had a Mirena IUD placed approximately 1 month ago for [facility]. Since that time, she denies dyspareunia, pelvic pain, non-menstrual bleeding, vaginal d/c, heavy bleeding.   Review of Systems  Physical Exam:  LMP 05/07/2022 (Approximate)  There is no height or weight on file to calculate BMI.  Pelvic exam:  Two IUD strings {DESC; PRESENT/ABSENT:17923} seen coming from the cervical os. EGBUS, vaginal vault and cervix: within normal limits   Assessment:   No diagnosis found.  IUD strings present in proper location; pt doing well  Plan: F/u if any signs of infection or can no longer feel the strings.   Haadi Santellan B. Odalis Jordan, [name] 06/07/2022 3:32 PM

## 2022-06-08 ENCOUNTER — Encounter: Payer: Self-pay | Admitting: Obstetrics and Gynecology

## 2022-06-08 ENCOUNTER — Ambulatory Visit: Payer: Medicaid Other | Admitting: Physician Assistant

## 2022-06-08 ENCOUNTER — Ambulatory Visit (INDEPENDENT_AMBULATORY_CARE_PROVIDER_SITE_OTHER): Payer: Medicaid Other | Admitting: Obstetrics and Gynecology

## 2022-06-08 ENCOUNTER — Encounter: Payer: Self-pay | Admitting: Physician Assistant

## 2022-06-08 VITALS — BP 115/80 | HR 73 | Temp 98.3°F | Resp 16 | Ht 65.0 in | Wt 136.0 lb

## 2022-06-08 VITALS — BP 110/80 | Ht 65.0 in | Wt 135.0 lb

## 2022-06-08 DIAGNOSIS — R41840 Attention and concentration deficit: Secondary | ICD-10-CM

## 2022-06-08 DIAGNOSIS — F419 Anxiety disorder, unspecified: Secondary | ICD-10-CM | POA: Diagnosis not present

## 2022-06-08 DIAGNOSIS — Z30431 Encounter for routine checking of intrauterine contraceptive device: Secondary | ICD-10-CM | POA: Diagnosis not present

## 2022-06-08 DIAGNOSIS — F32A Depression, unspecified: Secondary | ICD-10-CM | POA: Diagnosis not present

## 2022-06-08 MED ORDER — SERTRALINE HCL 25 MG PO TABS: 25.0000 mg | ORAL_TABLET | Freq: Every day | ORAL | 1 refills | Status: DC

## 2022-06-08 MED ORDER — AMPHETAMINE-DEXTROAMPHETAMINE 10 MG PO TABS: 10.0000 mg | ORAL_TABLET | Freq: Every day | ORAL | 0 refills | Status: DC | PRN

## 2022-06-08 NOTE — Patient Instructions (Signed)
I value your feedback and you entrusting us with your care. If you get a [place] patient survey, I would appreciate you taking the time to let us know about your experience today. Thank you! ? ? ?

## 2022-06-08 NOTE — Progress Notes (Signed)
[facility] [address] [place], [place] [address]  Internal MEDICINE  Office Visit Note  Patient Name: Carolyn Edwards  [id number]  [id number]  Date of Service: 06/08/2022  Chief Complaint  Patient presents with   Follow-up   Medication Refill    HPI Pt is here for routine follow up -Doing well with zoloft helping stress and anxiety -new puppy does keep her up at night as he is in the bed with her and is working to transition sleeping arrangement -Doing well with adderall, no side effects. Uses as needed. Does need refill today  Current Medication: Outpatient Encounter Medications as of 06/08/2022  Medication Sig   levonorgestrel (MIRENA) 20 MCG/DAY IUD 1 each by Intrauterine route once.   Multiple Vitamins-Minerals (HAIR SKIN AND NAILS FORMULA) TABS Take 3 tablets by mouth daily.   valACYclovir (VALTREX) 1000 MG tablet Take 1 tablet (1,000 mg total) by mouth 2 (two) times daily.   [DISCONTINUED] amphetamine-dextroamphetamine (ADDERALL) 10 MG tablet Take 1 tablet (10 mg total) by mouth daily as needed.   [DISCONTINUED] sertraline (ZOLOFT) 25 MG tablet TAKE 1 TABLET (25 MG TOTAL) BY MOUTH DAILY.   amphetamine-dextroamphetamine (ADDERALL) 10 MG tablet Take 1 tablet (10 mg total) by mouth daily as needed.   sertraline (ZOLOFT) 25 MG tablet Take 1 tablet (25 mg total) by mouth daily.   [DISCONTINUED] amoxicillin-clavulanate (AUGMENTIN) 875-125 MG tablet Take 1 tablet by mouth every 12 (twelve) hours.   No facility-administered encounter medications on file as of 06/08/2022.    Surgical History: Past Surgical History:  Procedure Laterality Date   BIOPSY N/A 03/15/2021   Procedure: BIOPSY;  Surgeon: Toney Reil, MD;  Location: [facility];  Service: Endoscopy;  Laterality: N/A;   BREAST ENHANCEMENT SURGERY     COLONOSCOPY WITH PROPOFOL N/A 03/15/2021   Procedure: COLONOSCOPY WITH PROPOFOL;  Surgeon: Toney Reil, MD;  Location: [facility];  Service: Endoscopy;  Laterality: N/A;   COLPOSCOPY  01/2005   bxs neg   ESOPHAGOGASTRODUODENOSCOPY (EGD) WITH PROPOFOL N/A 03/15/2021   Procedure: ESOPHAGOGASTRODUODENOSCOPY (EGD) WITH PROPOFOL;  Surgeon: Toney Reil, MD;  Location: [facility];  Service: Endoscopy;  Laterality: N/A;   FOREIGN BODY REMOVAL N/A 03/22/2017   Procedure: FOREIGN BODY REMOVAL;  Surgeon: Vena Austria, MD;  Location: [facility] OR[facility];  Service: Gynecology;  Laterality: N/A;   INTRAUTERINE DEVICE (IUD) INSERTION  08/2006   Mirena    Medical History: Past Medical History:  Diagnosis Date   ADHD    Dysmetabolic syndrome X 03/23/2011   Genital warts    LGSIL on Pap smear of cervix 2006   Migraine    Ovarian cyst 11/23/2015   Thyroid nodule     Family History: Family History  Problem Relation Age of Onset   Diabetes Mother    Colon cancer Maternal Grandfather 3   Lung cancer Paternal Grandfather     Social History   Socioeconomic History   Marital status: Married    Spouse name: Not on file   Number of children: Not on file   Years of education: Not on file   Highest education level: Not on file  Occupational History   Not on file  Tobacco Use   Smoking status: Never   Smokeless tobacco: Never  Vaping Use   Vaping Use: Never used  Substance and Sexual Activity   Alcohol use: No   Drug use: No   Sexual activity: Yes    Birth control/protection: I.U.D.  Comment: Mirena  Other Topics Concern   Not on file  Social History Narrative   Not on file   Social Determinants of Health   Financial Resource Strain: Not on file  Food Insecurity: Not on file  Transportation Needs: Not on file  Physical Activity: Insufficiently Active (01/02/2017)   Exercise Vital Sign    Days of Exercise per Week: 3 days    Minutes of Exercise per Session: 30 min  Stress: No Stress Concern Present (01/02/2017)   [employer] of Occupational Health - Occupational Stress Questionnaire     Feeling of Stress : Only a little  Social Connections: Somewhat Isolated (01/02/2017)   Social Connection and Isolation Panel [NHANES]    Frequency of Communication with Friends and Family: More than three times a week    Frequency of Social Gatherings with Friends and Family: Twice a week    Attends Religious Services: Never    Database administrator or Organizations: No    Attends Banker Meetings: Never    Marital Status: Married  Catering manager Violence: Not At Risk (01/02/2017)   Humiliation, Afraid, Rape, and Kick questionnaire    Fear of Current or Ex-Partner: No    Emotionally Abused: No    Physically Abused: No    Sexually Abused: No      Review of Systems  Constitutional:  Negative for chills and diaphoresis.  HENT:  Negative for ear pain, postnasal drip and sinus pressure.   Eyes:  Negative for photophobia, discharge, redness, itching and visual disturbance.  Respiratory:  Negative for cough, shortness of breath and wheezing.   Cardiovascular:  Negative for chest pain, palpitations and leg swelling.  Gastrointestinal:  Negative for abdominal pain, constipation, diarrhea, nausea and vomiting.  Genitourinary:  Negative for dysuria and flank pain.  Musculoskeletal:  Negative for arthralgias, back pain, gait problem and neck pain.  Skin:  Negative for color change.  Allergic/Immunologic: Negative for environmental allergies and food allergies.  Neurological:  Negative for dizziness and headaches.  Hematological:  Does not bruise/bleed easily.  Psychiatric/Behavioral:  Positive for decreased concentration and sleep disturbance. Negative for agitation, behavioral problems (depression), hallucinations, self-injury and suicidal ideas. The patient is nervous/anxious.     Vital Signs: BP 115/80   Pulse 73   Temp 98.3 F (36.8 C)   Resp 16   Ht 5\' 5"  (1.651 m)   Wt 136 lb (61.7 kg)   LMP 05/11/2022 (Approximate)   SpO2 99%   BMI 22.63 kg/m    Physical  Exam Vitals and nursing note reviewed.  Constitutional:      General: She is not in acute distress.    Appearance: She is well-developed and normal weight. She is not diaphoretic.  HENT:     Head: Normocephalic and atraumatic.     Mouth/Throat:     Pharynx: No oropharyngeal exudate.  Eyes:     Pupils: Pupils are equal, round, and reactive to light.  Neck:     Thyroid: No thyromegaly.     Vascular: No JVD.     Trachea: No tracheal deviation.  Cardiovascular:     Rate and Rhythm: Normal rate and regular rhythm.     Heart sounds: Normal heart sounds. No murmur heard.    No friction rub. No gallop.  Pulmonary:     Effort: Pulmonary effort is normal. No respiratory distress.     Breath sounds: No wheezing or rales.  Chest:     Chest wall: No tenderness.  Breasts:    Right: Normal. No mass.     Left: Normal. No mass.  Abdominal:     General: Bowel sounds are normal.     Palpations: Abdomen is soft.  Musculoskeletal:        General: Normal range of motion.     Cervical back: Normal range of motion and neck supple.  Lymphadenopathy:     Cervical: No cervical adenopathy.  Skin:    General: Skin is warm and dry.  Neurological:     Mental Status: She is alert and oriented to person, place, and time.     Cranial Nerves: No cranial nerve deficit.  Psychiatric:        Behavior: Behavior normal.        Thought Content: Thought content normal.        Judgment: Judgment normal.        Assessment/Plan: 1. Anxiety and depression Continue zoloft as before - sertraline (ZOLOFT) 25 MG tablet; Take 1 tablet (25 mg total) by mouth daily.  Dispense: 90 tablet; Refill: 1  2. Attention and concentration deficit May continue adderall as needed as before - amphetamine-dextroamphetamine (ADDERALL) 10 MG tablet; Take 1 tablet (10 mg total) by mouth daily as needed.  Dispense: 60 tablet; Refill: 0 [place] Controlled Substance Database was reviewed by me for overdose risk score (ORS) Refilled  Controlled medications today. Reviewed risks and possible side effects associated with taking Stimulants. Combination of these drugs with other psychotropic medications could cause dizziness and drowsiness. Pt needs to Monitor symptoms and exercise caution in driving and operating heavy machinery to avoid damages to oneself, to others and to the surroundings. Patient verbalized understanding in this matter. Dependence and abuse for these drugs will be monitored closely. A Controlled substance policy and procedure is on file which allows [facility] to order a urine drug screen test at any visit. Patient understands and agrees with the plan..     General Counseling: Kathie Dike understanding of the findings of todays visit and agrees with plan of treatment. I have discussed any further diagnostic evaluation that may be needed or ordered today. We also reviewed her medications today. she has been encouraged to call the office with any questions or concerns that should arise related to todays visit.    No orders of the defined types were placed in this encounter.   Meds ordered this encounter  Medications   amphetamine-dextroamphetamine (ADDERALL) 10 MG tablet    Sig: Take 1 tablet (10 mg total) by mouth daily as needed.    Dispense:  60 tablet    Refill:  0   sertraline (ZOLOFT) 25 MG tablet    Sig: Take 1 tablet (25 mg total) by mouth daily.    Dispense:  90 tablet    Refill:  1    This patient was seen by Lynn Ito, [name] in collaboration with Dr. Beverely Risen as a part of collaborative care agreement.   Total time spent:30 Minutes Time spent includes review of chart, medications, test results, and follow up plan with the patient.      Dr Lyndon Code Internal medicine

## 2022-08-07 ENCOUNTER — Ambulatory Visit
Admission: RE | Admit: 2022-08-07 | Discharge: 2022-08-07 | Disposition: A | Discharge status: Home / Self Care | Payer: Medicaid Other | Source: Ambulatory Visit | Attending: Nurse Practitioner | Admitting: Nurse Practitioner

## 2022-08-07 ENCOUNTER — Encounter: Payer: Self-pay | Admitting: Nurse Practitioner

## 2022-08-07 ENCOUNTER — Ambulatory Visit: Payer: Medicaid Other | Admitting: Nurse Practitioner

## 2022-08-07 VITALS — BP 112/80 | HR 60 | Temp 98.2°F | Resp 16 | Ht 65.0 in | Wt 138.8 lb

## 2022-08-07 DIAGNOSIS — S3991XD Unspecified injury of abdomen, subsequent encounter: Secondary | ICD-10-CM

## 2022-08-07 DIAGNOSIS — R1030 Lower abdominal pain, unspecified: Secondary | ICD-10-CM

## 2022-08-07 DIAGNOSIS — W5512XD Struck by horse, subsequent encounter: Secondary | ICD-10-CM | POA: Diagnosis not present

## 2022-08-07 MED ORDER — IOHEXOL 300 MG/ML  SOLN
100.0000 mL | Freq: Once | INTRAMUSCULAR | Status: AC | PRN
  Administered 2022-08-07: 100 mL via INTRAVENOUS

## 2022-08-07 NOTE — Progress Notes (Signed)
[facility] [address] [place], [place] [address]  Internal MEDICINE  Office Visit Note  Patient Name: Carolyn Edwards  [id number]  [id number]  Date of Service: 08/07/2022  Chief Complaint  Patient presents with   Acute Visit   Abdominal Injury    Kicked in lower abdomen on Saturday by horse     HPI Carolyn Edwards presents for an acute sick visit for kicked in abdomen by a wild horse.  Struck hard,  No imaging done  Lower abdomen swollen and bruised.  Small firm knot in mid abdomen.    Current Medication:  Outpatient Encounter Medications as of 08/07/2022  Medication Sig   amphetamine-dextroamphetamine (ADDERALL) 10 MG tablet Take 1 tablet (10 mg total) by mouth daily as needed.   levonorgestrel (MIRENA) 20 MCG/DAY IUD 1 each by Intrauterine route once.   Multiple Vitamins-Minerals (HAIR SKIN AND NAILS FORMULA) TABS Take 3 tablets by mouth daily.   sertraline (ZOLOFT) 25 MG tablet Take 1 tablet (25 mg total) by mouth daily.   valACYclovir (VALTREX) 1000 MG tablet Take 1 tablet (1,000 mg total) by mouth 2 (two) times daily.   No facility-administered encounter medications on file as of 08/07/2022.      Medical History: Past Medical History:  Diagnosis Date   ADHD    Dysmetabolic syndrome X 03/23/2011   Genital warts    LGSIL on Pap smear of cervix 2006   Migraine    Ovarian cyst 11/23/2015   Thyroid nodule      Vital Signs: BP 112/80   Pulse 60   Temp 98.2 F (36.8 C)   Resp 16   Ht 5\' 5"  (1.651 m)   Wt 138 lb 12.8 oz (63 kg)   SpO2 99%   BMI 23.10 kg/m    Review of Systems  Constitutional: Negative.   HENT: Negative.    Respiratory: Negative.  Negative for cough, chest tightness, shortness of breath and wheezing.        Rib pain left side   Cardiovascular: Negative.  Negative for chest pain and palpitations.  Gastrointestinal:  Positive for abdominal distention and abdominal pain. Negative for constipation, diarrhea, nausea and vomiting.        Abdominal bruising with swelling   Musculoskeletal:  Positive for back pain.    Physical Exam Vitals reviewed.  Constitutional:      General: She is not in acute distress.    Appearance: Normal appearance. She is normal weight. She is not ill-appearing.  HENT:     Head: Normocephalic and atraumatic.  Eyes:     Pupils: Pupils are equal, round, and reactive to light.  Cardiovascular:     Rate and Rhythm: Normal rate and regular rhythm.  Pulmonary:     Effort: Pulmonary effort is normal. No respiratory distress.  Abdominal:     General: Bowel sounds are normal. There is distension (esp lower abdomen).     Palpations: Abdomen is soft. There is no shifting dullness, fluid wave, hepatomegaly, splenomegaly, mass or pulsatile mass.     Tenderness: There is abdominal tenderness in the periumbilical area, left upper quadrant and left lower quadrant. There is guarding.     Hernia: No hernia is present.       Comments: Marked area is approximately where the bruise is and the size of it.  Neurological:     Mental Status: She is alert.  Psychiatric:        Mood and Affect: Mood normal.  Behavior: Behavior normal.       Assessment/Plan: 1. Lower abdominal pain Stat CT abdomen pelvis ordered to rule out injury or internal bleeding  - CT Abdomen Pelvis W Contrast; Future  2. Blunt abdominal trauma, subsequent encounter Stat CT ordered to rule out internal injury or internal bleeding.  - CT Abdomen Pelvis W Contrast; Future  3. Struck by horse, subsequent encounter Stat CT abdomen pelvis ordered  - CT Abdomen Pelvis W Contrast; Future   General Counseling: Carolyn Edwards verbalizes understanding of the findings of todays visit and agrees with plan of treatment. I have discussed any further diagnostic evaluation that may be needed or ordered today. We also reviewed her medications today. she has been encouraged to call the office with any questions or concerns that should arise  related to todays visit.    Counseling:    Orders Placed This Encounter  Procedures   CT Abdomen Pelvis W Contrast    No orders of the defined types were placed in this encounter.   Return if symptoms worsen or fail to improve, for will call with stat CT results and next steps. .  [place] Controlled Substance Database was reviewed by me for overdose risk score (ORS)  Time spent:30 Minutes Time spent with patient included reviewing progress notes, labs, imaging studies, and discussing plan for follow up.   This patient was seen by Sallyanne Kuster, FNP-C in collaboration with Dr. Beverely Risen as a part of collaborative care agreement.  Carolyn Edwards R. Tedd Sias, MSN, FNP-C Internal Medicine

## 2022-08-08 ENCOUNTER — Telehealth: Payer: Self-pay

## 2022-08-08 NOTE — Progress Notes (Signed)
Please call the patient and let her know that there was no internal bleeding identified. There was scattered soft tissue swelling and bruising which will resolve on its own over time.

## 2022-08-08 NOTE — Telephone Encounter (Signed)
Patient notified

## 2022-08-08 NOTE — Telephone Encounter (Signed)
-----   Message from Sallyanne Kuster, NP sent at 08/08/2022  9:26 AM EDT ----- Please call the patient and let her know that there was no internal bleeding identified. There was scattered soft tissue swelling and bruising which will resolve on its own over time.

## 2022-10-05 ENCOUNTER — Encounter: Payer: Self-pay | Admitting: Physician Assistant

## 2022-10-05 ENCOUNTER — Ambulatory Visit: Payer: Medicaid Other | Admitting: Physician Assistant

## 2022-10-05 VITALS — BP 115/80 | HR 77 | Temp 98.2°F | Resp 16 | Ht 65.0 in | Wt 140.0 lb

## 2022-10-05 DIAGNOSIS — E559 Vitamin D deficiency, unspecified: Secondary | ICD-10-CM

## 2022-10-05 DIAGNOSIS — E538 Deficiency of other specified B group vitamins: Secondary | ICD-10-CM | POA: Diagnosis not present

## 2022-10-05 DIAGNOSIS — E782 Mixed hyperlipidemia: Secondary | ICD-10-CM

## 2022-10-05 DIAGNOSIS — R5383 Other fatigue: Secondary | ICD-10-CM

## 2022-10-05 DIAGNOSIS — R41840 Attention and concentration deficit: Secondary | ICD-10-CM

## 2022-10-05 DIAGNOSIS — Z0001 Encounter for general adult medical examination with abnormal findings: Secondary | ICD-10-CM

## 2022-10-05 DIAGNOSIS — R7989 Other specified abnormal findings of blood chemistry: Secondary | ICD-10-CM

## 2022-10-05 DIAGNOSIS — R300 Dysuria: Secondary | ICD-10-CM

## 2022-10-05 MED ORDER — AMPHETAMINE-DEXTROAMPHET ER 10 MG PO CP24: 10.0000 mg | ORAL_CAPSULE | Freq: Every day | ORAL | 0 refills | Status: DC
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
## 2022-10-06 ENCOUNTER — Telehealth: Payer: Self-pay

## 2022-10-06 ENCOUNTER — Other Ambulatory Visit: Payer: Self-pay

## 2022-10-06 MED ORDER — NITROFURANTOIN MONOHYD MACRO 100 MG PO CAPS: 100.0000 mg | ORAL_CAPSULE | Freq: Two times a day (BID) | ORAL | 0 refills | Status: DC

## 2022-10-06 NOTE — Telephone Encounter (Signed)
As per lauren sent macrobid to [facility] and advised that we still waiting for culture

## 2022-10-09 ENCOUNTER — Ambulatory Visit: Payer: Medicaid Other | Admitting: Physician Assistant

## 2022-10-09 LAB — MICROSCOPIC EXAMINATION
Casts: NONE SEEN /LPF
Epithelial Cells (non renal): >10 /HPF — AB (ref 0–10)

## 2022-10-09 LAB — UA/M W/RFLX CULTURE, ROUTINE
Bilirubin, UA: NEGATIVE
Glucose, UA: NEGATIVE
Ketones, UA: NEGATIVE
Nitrite, UA: NEGATIVE
Protein,UA: NEGATIVE
Specific Gravity, UA: 1.025 (ref 1.005–1.030)
Urobilinogen, Ur: 0.2 mg/dL (ref 0.2–1.0)
pH, UA: 5.5 (ref 5.0–7.5)

## 2022-10-09 LAB — URINE CULTURE, REFLEX

## 2022-10-10 ENCOUNTER — Telehealth: Payer: Self-pay

## 2022-10-10 NOTE — Progress Notes (Signed)
McDonough, Salomon Fick, [name]   Chief Complaint  Patient presents with   IUD Check    Spotting every time wipes since IUD insertion, bright red and brown at times. No abnormal cramping.    HPI:      [name] is a 37 y.o. G1P1001 whose LMP was No LMP recorded. (Menstrual status: IUD)., presents today for persistent BTB with IUD.  Mirena IUD placed 05/11/22 for [facility]. Has had daily light bleeding/spotting, sometimes with wiping only since placement. Has never stopped. Occas sexually active, recently with pelvic pain during sex and had to stop. Also felt like sandpaper with wiping recently and saw a red area vaginally (not after sexually activity). Had abnormal UA 10/05/22 with PCP and neg C&S. Started on macrobid initially but completed now. No increased vag d/c, odor.    Patient Active Problem List   Diagnosis Date Noted   Chronic diarrhea of unknown origin    Abdominal bloating    Other fatigue 10/25/2017   Attention and concentration deficit 10/25/2017    Past Surgical History:  Procedure Laterality Date   BIOPSY N/A 03/15/2021   Procedure: BIOPSY;  Surgeon: Toney Reil, MD;  Location: [facility];  Service: Endoscopy;  Laterality: N/A;   BREAST ENHANCEMENT SURGERY     COLONOSCOPY WITH PROPOFOL N/A 03/15/2021   Procedure: COLONOSCOPY WITH PROPOFOL;  Surgeon: Toney Reil, MD;  Location: [facility];  Service: Endoscopy;  Laterality: N/A;   COLPOSCOPY  01/2005   bxs neg   ESOPHAGOGASTRODUODENOSCOPY (EGD) WITH PROPOFOL N/A 03/15/2021   Procedure: ESOPHAGOGASTRODUODENOSCOPY (EGD) WITH PROPOFOL;  Surgeon: Toney Reil, MD;  Location: [facility];  Service: Endoscopy;  Laterality: N/A;   FOREIGN BODY REMOVAL N/A 03/22/2017   Procedure: FOREIGN BODY REMOVAL;  Surgeon: Vena Austria, MD;  Location: [facility];  Service: Gynecology;  Laterality: N/A;   INTRAUTERINE DEVICE (IUD) INSERTION  08/2006   Mirena    Family History   Problem Relation Age of Onset   Diabetes Mother    Colon cancer Maternal Grandfather 10   Lung cancer Paternal Grandfather     Social History   Socioeconomic History   Marital status: Married    Spouse name: Not on file   Number of children: Not on file   Years of education: Not on file   Highest education level: Not on file  Occupational History   Not on file  Tobacco Use   Smoking status: Never   Smokeless tobacco: Never  Vaping Use   Vaping status: Never Used  Substance and Sexual Activity   Alcohol use: No   Drug use: No   Sexual activity: Yes    Birth control/protection: I.U.D.    Comment: Mirena  Other Topics Concern   Not on file  Social History Narrative   Not on file   Social Determinants of Health   Financial Resource Strain: Not on file  Food Insecurity: Not on file  Transportation Needs: Not on file  Physical Activity: Insufficiently Active (01/02/2017)   Exercise Vital Sign    Days of Exercise per Week: 3 days    Minutes of Exercise per Session: 30 min  Stress: No Stress Concern Present (01/02/2017)   [employer] of Occupational Health - Occupational Stress Questionnaire    Feeling of Stress : Only a little  Social Connections: Somewhat Isolated (01/02/2017)   Social Connection and Isolation Panel [NHANES]    Frequency of Communication with Friends and Family: More than  three times a week    Frequency of Social Gatherings with Friends and Family: Twice a week    Attends Religious Services: Never    Database administrator or Organizations: No    Attends Banker Meetings: Never    Marital Status: Married  Catering manager Violence: Not At Risk (01/02/2017)   Humiliation, Afraid, Rape, and Kick questionnaire    Fear of Current or Ex-Partner: No    Emotionally Abused: No    Physically Abused: No    Sexually Abused: No    Outpatient Medications Prior to Visit  Medication Sig Dispense Refill   amphetamine-dextroamphetamine  (ADDERALL XR) 10 MG 24 hr capsule Take 1 capsule (10 mg total) by mouth daily. 10 capsule 0   levonorgestrel (MIRENA) 20 MCG/DAY IUD 1 each by Intrauterine route once.     nitrofurantoin, macrocrystal-monohydrate, (MACROBID) 100 MG capsule Take 1 capsule (100 mg total) by mouth 2 (two) times daily. 14 capsule 0   Multiple Vitamins-Minerals (HAIR SKIN AND NAILS FORMULA) TABS Take 3 tablets by mouth daily.     valACYclovir (VALTREX) 1000 MG tablet Take 1 tablet (1,000 mg total) by mouth 2 (two) times daily. 60 tablet 1   No facility-administered medications prior to visit.      ROS:  Review of Systems  Constitutional:  Negative for fever.  Gastrointestinal:  Negative for blood in stool, constipation, diarrhea, nausea and vomiting.  Genitourinary:  Positive for dyspareunia and vaginal bleeding. Negative for dysuria, flank pain, frequency, hematuria, urgency, vaginal discharge and vaginal pain.  Musculoskeletal:  Negative for back pain.  Skin:  Negative for rash.   BREAST: No symptoms   OBJECTIVE:   Vitals:  BP 100/64   Ht 5\' 4"  (1.626 m)   Wt 141 lb (64 kg)   BMI 24.20 kg/m   Physical Exam Vitals reviewed.  Constitutional:      Appearance: She is well-developed.  Pulmonary:     Effort: Pulmonary effort is normal.  Genitourinary:    General: Normal vulva.     Pubic Area: No rash.      Labia:        Right: No rash, tenderness or lesion.        Left: No rash, tenderness or lesion.      Vagina: Normal. No vaginal discharge, erythema, tenderness or bleeding.     Cervix: Normal. No cervical motion tenderness or cervical bleeding.     Uterus: Normal. Not enlarged and not tender.      Adnexa: Right adnexa normal and left adnexa normal.       Right: No mass or tenderness.         Left: No mass or tenderness.       Comments: IUD STRINGS IN CX OS Musculoskeletal:        General: Normal range of motion.     Cervical back: Normal range of motion.  Skin:    General: Skin is warm  and dry.  Neurological:     General: No focal deficit present.     Mental Status: She is alert and oriented to person, place, and time.  Psychiatric:        Mood and Affect: Mood normal.        Behavior: Behavior normal.        Thought Content: Thought content normal.        Judgment: Judgment normal.     Assessment/Plan: Breakthrough bleeding with IUD - Plan: US PELVIC COMPLETE WITH TRANSVAGINAL; check  GYN u/s given persistent sx after placement 6 months ago. IUD strings in cx os; neg GYN exam for pain. Will f/u with results. Pt to schedule appt.   Encounter for routine checking of intrauterine contraceptive device (IUD) - Plan: US PELVIC COMPLETE WITH TRANSVAGINAL    Return if symptoms worsen or fail to improve.  Ashlei Chinchilla B. Tahmir Kleckner, [name] 10/16/2022 5:09 PM

## 2022-10-10 NOTE — Telephone Encounter (Signed)
Spoke with patient regarding urine culture results. Patient states she will go see her OB/GYN next week to make sure nothing is wrong with her IUD.

## 2022-10-10 NOTE — Telephone Encounter (Signed)
-----   Message from Carlean Jews sent at 10/09/2022  2:20 PM EDT ----- Please let her know that her urine culture came back with a high amount of mixed flora, this typically doesn't require treatment

## 2022-10-16 ENCOUNTER — Encounter: Payer: Self-pay | Admitting: Obstetrics and Gynecology

## 2022-10-16 ENCOUNTER — Ambulatory Visit: Payer: Medicaid Other | Admitting: Obstetrics and Gynecology

## 2022-10-16 VITALS — BP 100/64 | Ht 64.0 in | Wt 141.0 lb

## 2022-10-16 DIAGNOSIS — N921 Excessive and frequent menstruation with irregular cycle: Secondary | ICD-10-CM | POA: Diagnosis not present

## 2022-10-16 DIAGNOSIS — Z30431 Encounter for routine checking of intrauterine contraceptive device: Secondary | ICD-10-CM | POA: Diagnosis not present

## 2022-10-16 DIAGNOSIS — Z975 Presence of (intrauterine) contraceptive device: Secondary | ICD-10-CM

## 2022-10-16 NOTE — Patient Instructions (Signed)
I value your feedback and you entrusting us with your care. If you get a [facility] patient survey, I would appreciate you taking the time to let us know about your experience today. Thank you! ? ? ?

## 2022-10-29 ENCOUNTER — Encounter: Payer: Self-pay | Admitting: Physician Assistant

## 2022-10-31 ENCOUNTER — Other Ambulatory Visit: Payer: Self-pay | Admitting: Physician Assistant

## 2022-10-31 DIAGNOSIS — R41840 Attention and concentration deficit: Secondary | ICD-10-CM

## 2022-10-31 MED ORDER — AMPHETAMINE-DEXTROAMPHET ER 15 MG PO CP24
15.0000 mg | ORAL_CAPSULE | ORAL | 0 refills | Status: DC
Start: 2022-10-31 — End: 2022-11-10

## 2022-11-03 ENCOUNTER — Telehealth: Payer: Self-pay | Admitting: Physician Assistant

## 2022-11-03 NOTE — Telephone Encounter (Signed)
Patient did not have labs done. Carolyn Edwards

## 2022-11-04 LAB — COMPREHENSIVE METABOLIC PANEL
ALT: 9 [IU]/L (ref 0–32)
AST: 14 [IU]/L (ref 0–40)
Albumin: 4.4 g/dL (ref 3.9–4.9)
Alkaline Phosphatase: 57 [IU]/L (ref 44–121)
BUN/Creatinine Ratio: 15 (ref 9–23)
BUN: 11 mg/dL (ref 6–20)
Bilirubin Total: 0.3 mg/dL (ref 0.0–1.2)
CO2: 23 mmol/L (ref 20–29)
Calcium: 9.3 mg/dL (ref 8.7–10.2)
Chloride: 103 mmol/L (ref 96–106)
Creatinine, Ser: 0.74 mg/dL (ref 0.57–1.00)
Globulin, Total: 2.1 g/dL (ref 1.5–4.5)
Glucose: 93 mg/dL (ref 70–99)
Potassium: 4.4 mmol/L (ref 3.5–5.2)
Sodium: 140 mmol/L (ref 134–144)
Total Protein: 6.5 g/dL (ref 6.0–8.5)
eGFR: 108 mL/min/{1.73_m2} (ref >59)

## 2022-11-04 LAB — CBC WITH DIFFERENTIAL/PLATELET
Basophils Absolute: 0 10*3/uL (ref 0.0–0.2)
Basos: 1 %
EOS (ABSOLUTE): 0.1 10*3/uL (ref 0.0–0.4)
Eos: 3 %
Hematocrit: 39.1 % (ref 34.0–46.6)
Hemoglobin: 12.7 g/dL (ref 11.1–15.9)
Immature Grans (Abs): 0 10*3/uL (ref 0.0–0.1)
Immature Granulocytes: 0 %
Lymphocytes Absolute: 1.2 10*3/uL (ref 0.7–3.1)
Lymphs: 26 %
MCH: 30.5 pg (ref 26.6–33.0)
MCHC: 32.5 g/dL (ref 31.5–35.7)
MCV: 94 fL (ref 79–97)
Monocytes Absolute: 0.4 10*3/uL (ref 0.1–0.9)
Monocytes: 8 %
Neutrophils Absolute: 3 10*3/uL (ref 1.4–7.0)
Neutrophils: 62 %
Platelets: 203 10*3/uL (ref 150–450)
RBC: 4.16 x10E6/uL (ref 3.77–5.28)
RDW: 11.3 % — ABNORMAL LOW (ref 11.7–15.4)
WBC: 4.7 10*3/uL (ref 3.4–10.8)

## 2022-11-04 LAB — LIPID PANEL WITH LDL/HDL RATIO
Cholesterol, Total: 158 mg/dL (ref 100–199)
HDL: 74 mg/dL (ref >39)
LDL Chol Calc (NIH): 70 mg/dL (ref 0–99)
LDL/HDL Ratio: 0.9 {ratio} (ref 0.0–3.2)
Triglycerides: 74 mg/dL (ref 0–149)
VLDL Cholesterol Cal: 14 mg/dL (ref 5–40)

## 2022-11-04 LAB — B12 AND FOLATE PANEL
Folate: 15.1 ng/mL (ref >3.0)
Vitamin B-12: 501 pg/mL (ref 232–1245)

## 2022-11-04 LAB — TSH+FREE T4
Free T4: 0.98 ng/dL (ref 0.82–1.77)
TSH: 0.936 u[IU]/mL (ref 0.450–4.500)

## 2022-11-04 LAB — VITAMIN D 25 HYDROXY (VIT D DEFICIENCY, FRACTURES): Vit D, 25-Hydroxy: 55.8 ng/mL (ref 30.0–100.0)

## 2022-11-10 ENCOUNTER — Ambulatory Visit: Payer: Medicaid Other | Admitting: Physician Assistant

## 2022-11-10 ENCOUNTER — Encounter: Payer: Self-pay | Admitting: Physician Assistant

## 2022-11-10 DIAGNOSIS — R41840 Attention and concentration deficit: Secondary | ICD-10-CM | POA: Diagnosis not present

## 2022-11-10 MED ORDER — AMPHETAMINE-DEXTROAMPHET ER 15 MG PO CP24: 15.0000 mg | ORAL_CAPSULE | ORAL | 0 refills | Status: DC

## 2022-11-10 NOTE — Progress Notes (Signed)
[facility] [address] [place], [place] [address]  Internal MEDICINE  Office Visit Note  Patient Name: Carolyn Edwards  [id number]  [id number]  Date of Service: 11/21/2022  Chief Complaint  Patient presents with   Follow-up    HPI Pt is here for routine follow up -Doing well with the 15mg  XR adderall and feels well on this. Feels calm all day -running daily  -labs reviewed and look good -Did have URI 2 weeks ago and is doing better now, just intermittent cough remaining  Current Medication: Outpatient Encounter Medications as of 11/10/2022  Medication Sig   [START ON 12/14/2022] amphetamine-dextroamphetamine (ADDERALL XR) 15 MG 24 hr capsule Take 1 capsule by mouth every morning.   amphetamine-dextroamphetamine (ADDERALL XR) 15 MG 24 hr capsule Take 1 capsule by mouth every morning.   levonorgestrel (MIRENA) 20 MCG/DAY IUD 1 each by Intrauterine route once.   nitrofurantoin, macrocrystal-monohydrate, (MACROBID) 100 MG capsule Take 1 capsule (100 mg total) by mouth 2 (two) times daily.   [DISCONTINUED] amphetamine-dextroamphetamine (ADDERALL XR) 15 MG 24 hr capsule Take 1 capsule by mouth every morning.   [START ON 01/12/2023] amphetamine-dextroamphetamine (ADDERALL XR) 15 MG 24 hr capsule Take 1 capsule by mouth every morning.   No facility-administered encounter medications on file as of 11/10/2022.    Surgical History: Past Surgical History:  Procedure Laterality Date   BIOPSY N/A 03/15/2021   Procedure: BIOPSY;  Surgeon: Toney Reil, MD;  Location: [facility];  Service: Endoscopy;  Laterality: N/A;   BREAST ENHANCEMENT SURGERY     COLONOSCOPY WITH PROPOFOL N/A 03/15/2021   Procedure: COLONOSCOPY WITH PROPOFOL;  Surgeon: Toney Reil, MD;  Location: [facility];  Service: Endoscopy;  Laterality: N/A;   COLPOSCOPY  01/2005   bxs neg   ESOPHAGOGASTRODUODENOSCOPY (EGD) WITH PROPOFOL N/A 03/15/2021   Procedure:  ESOPHAGOGASTRODUODENOSCOPY (EGD) WITH PROPOFOL;  Surgeon: Toney Reil, MD;  Location: [facility];  Service: Endoscopy;  Laterality: N/A;   FOREIGN BODY REMOVAL N/A 03/22/2017   Procedure: FOREIGN BODY REMOVAL;  Surgeon: Vena Austria, MD;  Location: [facility];  Service: Gynecology;  Laterality: N/A;   INTRAUTERINE DEVICE (IUD) INSERTION  08/2006   Mirena    Medical History: Past Medical History:  Diagnosis Date   ADHD    Dysmetabolic syndrome X 03/23/2011   Genital warts    LGSIL on Pap smear of cervix 2006   Migraine    Ovarian cyst 11/23/2015   Thyroid nodule     Family History: Family History  Problem Relation Age of Onset   Diabetes Mother    Colon cancer Maternal Grandfather 55   Lung cancer Paternal Grandfather     Social History   Socioeconomic History   Marital status: Married    Spouse name: Not on file   Number of children: Not on file   Years of education: Not on file   Highest education level: Not on file  Occupational History   Not on file  Tobacco Use   Smoking status: Never   Smokeless tobacco: Never  Vaping Use   Vaping status: Never Used  Substance and Sexual Activity   Alcohol use: No   Drug use: No   Sexual activity: Yes    Birth control/protection: I.U.D.    Comment: Mirena  Other Topics Concern   Not on file  Social History Narrative   Not on file   Social Determinants of Health   Financial Resource Strain: Not on file  Food Insecurity: Not on file  Transportation Needs: Not on file  Physical Activity: Insufficiently Active (01/02/2017)   Exercise Vital Sign    Days of Exercise per Week: 3 days    Minutes of Exercise per Session: 30 min  Stress: No Stress Concern Present (01/02/2017)   [employer] of Occupational Health - Occupational Stress Questionnaire    Feeling of Stress : Only a little  Social Connections: Somewhat Isolated (01/02/2017)   Social Connection and Isolation Panel [NHANES]    Frequency of  Communication with Friends and Family: More than three times a week    Frequency of Social Gatherings with Friends and Family: Twice a week    Attends Religious Services: Never    Database administrator or Organizations: No    Attends Banker Meetings: Never    Marital Status: Married  Catering manager Violence: Not At Risk (01/02/2017)   Humiliation, Afraid, Rape, and Kick questionnaire    Fear of Current or Ex-Partner: No    Emotionally Abused: No    Physically Abused: No    Sexually Abused: No      Review of Systems  Constitutional:  Negative for chills, fatigue and unexpected weight change.  HENT:  Negative for congestion, rhinorrhea, sneezing and sore throat.   Eyes:  Negative for redness.  Respiratory:  Positive for cough. Negative for chest tightness and shortness of breath.   Cardiovascular:  Negative for chest pain and palpitations.  Gastrointestinal:  Negative for abdominal pain, constipation, diarrhea, nausea and vomiting.  Genitourinary:  Negative for dysuria and frequency.  Musculoskeletal:  Negative for arthralgias, back pain, joint swelling and neck pain.  Skin:  Negative for rash.  Neurological: Negative.  Negative for tremors and numbness.  Hematological:  Negative for adenopathy. Does not bruise/bleed easily.  Psychiatric/Behavioral:  Negative for behavioral problems (Depression), sleep disturbance and suicidal ideas. The patient is not nervous/anxious.     Vital Signs: BP 108/70   Pulse 100   Temp 97.9 F (36.6 C)   Resp 16   Ht 5\' 5"  (1.651 m)   Wt 138 lb 3.2 oz (62.7 kg)   SpO2 99%   BMI 23.00 kg/m    Physical Exam Vitals and nursing note reviewed.  Constitutional:      General: She is not in acute distress.    Appearance: She is well-developed and normal weight. She is not diaphoretic.  HENT:     Head: Normocephalic and atraumatic.     Mouth/Throat:     Pharynx: No oropharyngeal exudate.  Eyes:     Pupils: Pupils are equal,  round, and reactive to light.  Neck:     Thyroid: No thyromegaly.     Vascular: No JVD.     Trachea: No tracheal deviation.  Cardiovascular:     Rate and Rhythm: Normal rate and regular rhythm.     Heart sounds: Normal heart sounds. No murmur heard.    No friction rub. No gallop.  Pulmonary:     Effort: Pulmonary effort is normal. No respiratory distress.     Breath sounds: No wheezing or rales.  Chest:     Chest wall: No tenderness.  Breasts:    Right: Normal. No mass.     Left: Normal. No mass.  Abdominal:     General: Bowel sounds are normal.     Palpations: Abdomen is soft.     Tenderness: There is abdominal tenderness.  Musculoskeletal:        General: Normal  range of motion.     Cervical back: Normal range of motion and neck supple.  Lymphadenopathy:     Cervical: No cervical adenopathy.  Skin:    General: Skin is warm and dry.  Neurological:     Mental Status: She is alert and oriented to person, place, and time.     Cranial Nerves: No cranial nerve deficit.  Psychiatric:        Behavior: Behavior normal.        Thought Content: Thought content normal.        Judgment: Judgment normal.        Assessment/Plan: 1. Attention and concentration deficit May continue on adderall as before, refill sent - amphetamine-dextroamphetamine (ADDERALL XR) 15 MG 24 hr capsule; Take 1 capsule by mouth every morning.  Dispense: 30 capsule; Refill: 0   General Counseling: Quanetta verbalizes understanding of the findings of todays visit and agrees with plan of treatment. I have discussed any further diagnostic evaluation that may be needed or ordered today. We also reviewed her medications today. she has been encouraged to call the office with any questions or concerns that should arise related to todays visit.    No orders of the defined types were placed in this encounter.   Meds ordered this encounter  Medications   amphetamine-dextroamphetamine (ADDERALL XR) 15 MG 24 hr  capsule    Sig: Take 1 capsule by mouth every morning.    Dispense:  30 capsule    Refill:  0   amphetamine-dextroamphetamine (ADDERALL XR) 15 MG 24 hr capsule    Sig: Take 1 capsule by mouth every morning.    Dispense:  30 capsule    Refill:  0   amphetamine-dextroamphetamine (ADDERALL XR) 15 MG 24 hr capsule    Sig: Take 1 capsule by mouth every morning.    Dispense:  30 capsule    Refill:  0    This patient was seen by Lynn Ito, [name] in collaboration with Dr. Beverely Risen as a part of collaborative care agreement.   Total time spent:30 Minutes Time spent includes review of chart, medications, test results, and follow up plan with the patient.      Dr Lyndon Code Internal medicine

## 2022-11-22 ENCOUNTER — Telehealth: Payer: Medicaid Other | Admitting: Physician Assistant

## 2022-11-22 DIAGNOSIS — J019 Acute sinusitis, unspecified: Secondary | ICD-10-CM

## 2022-11-22 DIAGNOSIS — B9689 Other specified bacterial agents as the cause of diseases classified elsewhere: Secondary | ICD-10-CM

## 2022-11-22 MED ORDER — DOXYCYCLINE HYCLATE 100 MG PO TABS
100.0000 mg | ORAL_TABLET | Freq: Two times a day (BID) | ORAL | 0 refills | Status: DC
Start: 2022-11-22 — End: 2023-06-28

## 2022-11-22 MED ORDER — FLUTICASONE PROPIONATE 50 MCG/ACT NA SUSP
2.0000 | Freq: Every day | NASAL | 0 refills | Status: DC
Start: 2022-11-22 — End: 2023-07-26

## 2022-11-22 MED ORDER — BENZONATATE 100 MG PO CAPS
100.0000 mg | ORAL_CAPSULE | Freq: Three times a day (TID) | ORAL | 0 refills | Status: DC | PRN
Start: 2022-11-22 — End: 2023-07-26

## 2022-11-22 NOTE — Patient Instructions (Signed)
[name], thank you for joining Piedad Climes, [name] for today's virtual visit.  While this provider is not your primary care provider (PCP), if your PCP is located in our provider database this encounter information will be shared with them immediately following your visit.   A [facility] MyChart account gives you access to today's visit and all your visits, tests, and labs performed at [facility] " click here if you don't have a [facility] MyChart account or go to [email or web address]  Consent: (Patient) Carolyn Edwards provided verbal consent for this virtual visit at the beginning of the encounter.  Current Medications:  Current Outpatient Medications:    [START ON 01/12/2023] amphetamine-dextroamphetamine (ADDERALL XR) 15 MG 24 hr capsule, Take 1 capsule by mouth every morning., Disp: 30 capsule, Rfl: 0   [START ON 12/14/2022] amphetamine-dextroamphetamine (ADDERALL XR) 15 MG 24 hr capsule, Take 1 capsule by mouth every morning., Disp: 30 capsule, Rfl: 0   amphetamine-dextroamphetamine (ADDERALL XR) 15 MG 24 hr capsule, Take 1 capsule by mouth every morning., Disp: 30 capsule, Rfl: 0   levonorgestrel (MIRENA) 20 MCG/DAY IUD, 1 each by Intrauterine route once., Disp: , Rfl:    nitrofurantoin, macrocrystal-monohydrate, (MACROBID) 100 MG capsule, Take 1 capsule (100 mg total) by mouth 2 (two) times daily., Disp: 14 capsule, Rfl: 0   Medications ordered in this encounter:  No orders of the defined types were placed in this encounter.    *If you need refills on other medications prior to your next appointment, please contact your pharmacy*  Follow-Up: Call back or seek an in-person evaluation if the symptoms worsen or if the condition fails to improve as anticipated.  [facility] [phone]  Other Instructions Please take antibiotic as directed.  Increase fluid intake.  Use Saline nasal spray.  Take a daily multivitamin. Take  the Flonase and Tessalon as directed. Continue Claritin OTC and consider adding on OTC Sudafed or switching to Claritin-D.  Place a humidifier in the bedroom.  Please call or return clinic if symptoms are not improving.  Sinusitis Sinusitis is redness, soreness, and swelling (inflammation) of the paranasal sinuses. Paranasal sinuses are air pockets within the bones of your face (beneath the eyes, the middle of the forehead, or above the eyes). In healthy paranasal sinuses, mucus is able to drain out, and air is able to circulate through them by way of your nose. However, when your paranasal sinuses are inflamed, mucus and air can become trapped. This can allow bacteria and other germs to grow and cause infection. Sinusitis can develop quickly and last only a short time (acute) or continue over a long period (chronic). Sinusitis that lasts for more than 12 weeks is considered chronic.  CAUSES  Causes of sinusitis include: Allergies. Structural abnormalities, such as displacement of the cartilage that separates your nostrils (deviated septum), which can decrease the air flow through your nose and sinuses and affect sinus drainage. Functional abnormalities, such as when the small hairs (cilia) that line your sinuses and help remove mucus do not work properly or are not present. SYMPTOMS  Symptoms of acute and chronic sinusitis are the same. The primary symptoms are pain and pressure around the affected sinuses. Other symptoms include: Upper toothache. Earache. Headache. Bad breath. Decreased sense of smell and taste. A cough, which worsens when you are lying flat. Fatigue. Fever. Thick drainage from your nose, which often is green and may contain pus (purulent). Swelling and warmth over the affected sinuses.  DIAGNOSIS  Your caregiver will perform a physical exam. During the exam, your caregiver may: Look in your nose for signs of abnormal growths in your nostrils (nasal polyps). Tap over the  affected sinus to check for signs of infection. View the inside of your sinuses (endoscopy) with a special imaging device with a light attached (endoscope), which is inserted into your sinuses. If your caregiver suspects that you have chronic sinusitis, one or more of the following tests may be recommended: Allergy tests. Nasal culture A sample of mucus is taken from your nose and sent to a lab and screened for bacteria. Nasal cytology A sample of mucus is taken from your nose and examined by your caregiver to determine if your sinusitis is related to an allergy. TREATMENT  Most cases of acute sinusitis are related to a viral infection and will resolve on their own within 10 days. Sometimes medicines are prescribed to help relieve symptoms (pain medicine, decongestants, nasal steroid sprays, or saline sprays).  However, for sinusitis related to a bacterial infection, your caregiver will prescribe antibiotic medicines. These are medicines that will help kill the bacteria causing the infection.  Rarely, sinusitis is caused by a fungal infection. In theses cases, your caregiver will prescribe antifungal medicine. For some cases of chronic sinusitis, surgery is needed. Generally, these are cases in which sinusitis recurs more than 3 times per year, despite other treatments. HOME CARE INSTRUCTIONS  Drink plenty of water. Water helps thin the mucus so your sinuses can drain more easily. Use a humidifier. Inhale steam 3 to 4 times a day (for example, sit in the bathroom with the shower running). Apply a warm, moist washcloth to your face 3 to 4 times a day, or as directed by your caregiver. Use saline nasal sprays to help moisten and clean your sinuses. Take over-the-counter or prescription medicines for pain, discomfort, or fever only as directed by your caregiver. SEEK IMMEDIATE MEDICAL CARE IF: You have increasing pain or severe headaches. You have nausea, vomiting, or drowsiness. You have swelling  around your face. You have vision problems. You have a stiff neck. You have difficulty breathing. MAKE SURE YOU:  Understand these instructions. Will watch your condition. Will get help right away if you are not doing well or get worse. Document Released: 01/16/2005 Document Revised: 04/10/2011 Document Reviewed: 01/31/2011 [facility] Patient Information 2014 [place], [place].    If you have been instructed to have an in-person evaluation today at a local Urgent Care facility, please use the link below. It will take you to a list of all of our available [facility], including address, phone number and hours of operation. Please do not delay care.  [facility]  If you or a family member do not have a primary care provider, use the link below to schedule a visit and establish care. When you choose a [facility] primary care physician or advanced practice provider, you gain a long-term partner in health. Find a Primary Care Provider  Learn more about [facility]'s in-office and virtual care options: [facility] - Get Care Now

## 2022-11-22 NOTE — Progress Notes (Signed)
Virtual Visit Consent   Carolyn Edwards, you are scheduled for a virtual visit with a [facility] provider today. Just as with appointments in the office, your consent must be obtained to participate. Your consent will be active for this visit and any virtual visit you may have with one of our providers in the next 365 days. If you have a MyChart account, a copy of this consent can be sent to you electronically.  As this is a virtual visit, video technology does not allow for your provider to perform a traditional examination. This may limit your provider's ability to fully assess your condition. If your provider identifies any concerns that need to be evaluated in person or the need to arrange testing (such as labs, EKG, etc.), we will make arrangements to do so. Although advances in technology are sophisticated, we cannot ensure that it will always work on either your end or our end. If the connection with a video visit is poor, the visit may have to be switched to a telephone visit. With either a video or telephone visit, we are not always able to ensure that we have a secure connection.  By engaging in this virtual visit, you consent to the provision of healthcare and authorize for your insurance to be billed (if applicable) for the services provided during this visit. Depending on your insurance coverage, you may receive a charge related to this service.  I need to obtain your verbal consent now. Are you willing to proceed with your visit today? Carolyn Edwards has provided verbal consent on 11/22/2022 for a virtual visit (video or telephone). Carolyn Edwards, [place]  Date: 11/22/2022 10:15 AM  Virtual Visit via Video Note   I, Carolyn Edwards, connected with  Carolyn Edwards  ([id number], 23-Jun-1985) on 11/22/22 at 10:00 AM EDT by a video-enabled telemedicine application and verified that I am speaking with the correct person using two identifiers.  Location: Patient:  Virtual Visit Location Patient: Home Provider: Virtual Visit Location Provider: Home Office   I discussed the limitations of evaluation and management by telemedicine and the availability of in person appointments. The patient expressed understanding and agreed to proceed.    History of Present Illness: Carolyn Edwards is a 38 y.o. who identifies as a female who was assigned female at birth, and is being seen today for 2.5 weeks of URI symptoms. Notes initially sick with nasal and sinus congestion, chest congestion and cough. Used OTC Mucinex and started an old Rx (partial) for Amoxicillin. Reoccurring and in past 4 days abruptly worsening with sore throat, sinus pain, facial pain, thick nasal discharge and productive cough. Denies fever, chills. Notes some chest tightness.   HPI: HPI  Problems:  Patient Active Problem List   Diagnosis Date Noted   Chronic diarrhea of unknown origin    Abdominal bloating    Other fatigue 10/25/2017   Attention and concentration deficit 10/25/2017    Allergies:  Allergies  Allergen Reactions   Imitrex [Sumatriptan] Anaphylaxis   Medications:  Current Outpatient Medications:    benzonatate (TESSALON) 100 MG capsule, Take 1 capsule (100 mg total) by mouth 3 (three) times daily as needed for cough., Disp: 30 capsule, Rfl: 0   doxycycline (VIBRA-TABS) 100 MG tablet, Take 1 tablet (100 mg total) by mouth 2 (two) times daily., Disp: 20 tablet, Rfl: 0   fluticasone (FLONASE) 50 MCG/ACT nasal spray, Place 2 sprays into both nostrils daily., Disp: 16 g, Rfl: 0   [  START ON 01/12/2023] amphetamine-dextroamphetamine (ADDERALL XR) 15 MG 24 hr capsule, Take 1 capsule by mouth every morning., Disp: 30 capsule, Rfl: 0   [START ON 12/14/2022] amphetamine-dextroamphetamine (ADDERALL XR) 15 MG 24 hr capsule, Take 1 capsule by mouth every morning., Disp: 30 capsule, Rfl: 0   amphetamine-dextroamphetamine (ADDERALL XR) 15 MG 24 hr capsule, Take 1 capsule by mouth every  morning., Disp: 30 capsule, Rfl: 0   levonorgestrel (MIRENA) 20 MCG/DAY IUD, 1 each by Intrauterine route once., Disp: , Rfl:   Observations/Objective: Patient is well-developed, well-nourished in no acute distress.  Resting comfortably at home.  Head is normocephalic, atraumatic.  No labored breathing. Speech is clear and coherent with logical content.  Patient is alert and oriented at baseline.   Assessment and Plan: 1. Acute bacterial sinusitis - fluticasone (FLONASE) 50 MCG/ACT nasal spray; Place 2 sprays into both nostrils daily.  Dispense: 16 g; Refill: 0 - benzonatate (TESSALON) 100 MG capsule; Take 1 capsule (100 mg total) by mouth 3 (three) times daily as needed for cough.  Dispense: 30 capsule; Refill: 0 - doxycycline (VIBRA-TABS) 100 MG tablet; Take 1 tablet (100 mg total) by mouth 2 (two) times daily.  Dispense: 20 tablet; Refill: 0  Rx Doxycycline.  Increase fluids.  Rest.  Saline nasal spray.  Probiotic.  Mucinex as directed.  Humidifier in bedroom. Tessalon and Flonase per orders.  Call or return to clinic if symptoms are not improving.   Follow Up Instructions: I discussed the assessment and treatment plan with the patient. The patient was provided an opportunity to ask questions and all were answered. The patient agreed with the plan and demonstrated an understanding of the instructions.  A copy of instructions were sent to the patient via MyChart unless otherwise noted below.   The patient was advised to call back or seek an in-person evaluation if the symptoms worsen or if the condition fails to improve as anticipated.    Carolyn Climes, [name]

## 2023-02-05 ENCOUNTER — Ambulatory Visit: Payer: Medicaid Other | Admitting: Physician Assistant

## 2023-03-20 ENCOUNTER — Telehealth: Payer: Self-pay | Admitting: Physician Assistant

## 2023-03-20 NOTE — Telephone Encounter (Signed)
Lvm & sent mychart msg to change 03/22/23 appointment to virtual-Carolyn Edwards

## 2023-03-22 ENCOUNTER — Encounter: Payer: Self-pay | Admitting: Physician Assistant

## 2023-03-22 ENCOUNTER — Telehealth (INDEPENDENT_AMBULATORY_CARE_PROVIDER_SITE_OTHER): Payer: Medicaid Other | Admitting: Physician Assistant

## 2023-03-22 VITALS — Ht 62.0 in | Wt 138.0 lb

## 2023-03-22 DIAGNOSIS — R41840 Attention and concentration deficit: Secondary | ICD-10-CM

## 2023-03-22 MED ORDER — AMPHETAMINE-DEXTROAMPHET ER 15 MG PO CP24: 15.0000 mg | ORAL_CAPSULE | ORAL | 0 refills | Status: DC

## 2023-03-22 MED ORDER — AMPHETAMINE-DEXTROAMPHET ER 15 MG PO CP24
15.0000 mg | ORAL_CAPSULE | ORAL | 0 refills | Status: DC
Start: 2023-04-18 — End: 2023-06-28

## 2023-03-22 NOTE — Progress Notes (Signed)
[facility] [address] [place], [place] [address]  Internal MEDICINE  Telephone Visit  Patient Name: Carolyn Edwards  [id number]  [id number]  Date of Service: 03/22/2023  I connected with the patient at 9:31 by telephone and verified the patients identity using two identifiers.   I discussed the limitations, risks, security and privacy concerns of performing an evaluation and management service by telephone and the availability of in person appointments. I also discussed with the patient that there may be a patient responsible charge related to the service.  The patient expressed understanding and agrees to proceed.    Chief Complaint  Patient presents with   Telephone Assessment    [phone]   Telephone Screen    Med refills    ADHD    HPI Pt is here for virtual follow up due to weather -Needs med refills, taking adderall 15mg  XR and is working well. No S/E -Sleeping ok, no heart racing, no anxiousness -Left eye lid healing and is still a little swollen, but improving -Called to cancel last visit, but miscommunication and it got marked as a NS, but she states she did call to reschedule this. She has therefore been out of her medication recently due to rescheduling follow up. She had some of her old lower dose she has taken in the meantime.  Current Medication: Outpatient Encounter Medications as of 03/22/2023  Medication Sig   benzonatate (TESSALON) 100 MG capsule Take 1 capsule (100 mg total) by mouth 3 (three) times daily as needed for cough.   doxycycline (VIBRA-TABS) 100 MG tablet Take 1 tablet (100 mg total) by mouth 2 (two) times daily.   fluticasone (FLONASE) 50 MCG/ACT nasal spray Place 2 sprays into both nostrils daily.   levonorgestrel (MIRENA) 20 MCG/DAY IUD 1 each by Intrauterine route once.   [DISCONTINUED] amphetamine-dextroamphetamine (ADDERALL XR) 15 MG 24 hr capsule Take 1 capsule by mouth every morning.   [DISCONTINUED]  amphetamine-dextroamphetamine (ADDERALL XR) 15 MG 24 hr capsule Take 1 capsule by mouth every morning.   [DISCONTINUED] amphetamine-dextroamphetamine (ADDERALL XR) 15 MG 24 hr capsule Take 1 capsule by mouth every morning.   amphetamine-dextroamphetamine (ADDERALL XR) 15 MG 24 hr capsule Take 1 capsule by mouth every morning.   [START ON 04/18/2023] amphetamine-dextroamphetamine (ADDERALL XR) 15 MG 24 hr capsule Take 1 capsule by mouth every morning.   [START ON 05/18/2023] amphetamine-dextroamphetamine (ADDERALL XR) 15 MG 24 hr capsule Take 1 capsule by mouth every morning.   No facility-administered encounter medications on file as of 03/22/2023.    Surgical History: Past Surgical History:  Procedure Laterality Date   BIOPSY N/A 03/15/2021   Procedure: BIOPSY;  Surgeon: Toney Reil, MD;  Location: [facility];  Service: Endoscopy;  Laterality: N/A;   BREAST ENHANCEMENT SURGERY     COLONOSCOPY WITH PROPOFOL N/A 03/15/2021   Procedure: COLONOSCOPY WITH PROPOFOL;  Surgeon: Toney Reil, MD;  Location: [facility];  Service: Endoscopy;  Laterality: N/A;   COLPOSCOPY  01/2005   bxs neg   ESOPHAGOGASTRODUODENOSCOPY (EGD) WITH PROPOFOL N/A 03/15/2021   Procedure: ESOPHAGOGASTRODUODENOSCOPY (EGD) WITH PROPOFOL;  Surgeon: Toney Reil, MD;  Location: [facility];  Service: Endoscopy;  Laterality: N/A;   FOREIGN BODY REMOVAL N/A 03/22/2017   Procedure: FOREIGN BODY REMOVAL;  Surgeon: Vena Austria, MD;  Location: [facility];  Service: Gynecology;  Laterality: N/A;   INTRAUTERINE DEVICE (IUD) INSERTION  08/2006   Mirena    Medical History: Past Medical History:  Diagnosis  Date   ADHD    Dysmetabolic syndrome X 03/23/2011   Genital warts    LGSIL on Pap smear of cervix 2006   Migraine    Ovarian cyst 11/23/2015   Thyroid nodule     Family History: Family History  Problem Relation Age of Onset   Diabetes Mother    Colon cancer Maternal  Grandfather 23   Lung cancer Paternal Grandfather     Social History   Socioeconomic History   Marital status: Married    Spouse name: Not on file   Number of children: Not on file   Years of education: Not on file   Highest education level: Not on file  Occupational History   Not on file  Tobacco Use   Smoking status: Never   Smokeless tobacco: Never  Vaping Use   Vaping status: Never Used  Substance and Sexual Activity   Alcohol use: No   Drug use: No   Sexual activity: Yes    Birth control/protection: I.U.D.    Comment: Mirena  Other Topics Concern   Not on file  Social History Narrative   Not on file   Social Drivers of Health   Financial Resource Strain: Not on file  Food Insecurity: Not on file  Transportation Needs: Not on file  Physical Activity: Insufficiently Active (01/02/2017)   Exercise Vital Sign    Days of Exercise per Week: 3 days    Minutes of Exercise per Session: 30 min  Stress: No Stress Concern Present (01/02/2017)   [employer] of Occupational Health - Occupational Stress Questionnaire    Feeling of Stress : Only a little  Social Connections: Somewhat Isolated (01/02/2017)   Social Connection and Isolation Panel [NHANES]    Frequency of Communication with Friends and Family: More than three times a week    Frequency of Social Gatherings with Friends and Family: Twice a week    Attends Religious Services: Never    Database administrator or Organizations: No    Attends Banker Meetings: Never    Marital Status: Married  Catering manager Violence: Not At Risk (01/02/2017)   Humiliation, Afraid, Rape, and Kick questionnaire    Fear of Current or Ex-Partner: No    Emotionally Abused: No    Physically Abused: No    Sexually Abused: No      Review of Systems  Constitutional:  Negative for chills and diaphoresis.  HENT:  Negative for ear pain and postnasal drip.   Eyes:  Negative for photophobia and redness.  Respiratory:   Negative for cough, shortness of breath and wheezing.   Cardiovascular:  Negative for chest pain, palpitations and leg swelling.  Gastrointestinal:  Negative for abdominal pain and vomiting.  Genitourinary:  Negative for dysuria.  Musculoskeletal:  Negative for back pain.  Skin:  Negative for color change.  Neurological:  Negative for dizziness and headaches.  Hematological:  Does not bruise/bleed easily.  Psychiatric/Behavioral:  Positive for decreased concentration. Negative for behavioral problems (depression), hallucinations, self-injury, sleep disturbance and suicidal ideas. The patient is not nervous/anxious.     Vital Signs: Ht 5\' 2"  (1.575 m)   Wt 138 lb (62.6 kg)   BMI 25.24 kg/m    Observation/Objective:  Pt is able to carry out conversation   Assessment/Plan: 1. Attention and concentration deficit (Primary) May continue adderall as before. 3 refills given - amphetamine-dextroamphetamine (ADDERALL XR) 15 MG 24 hr capsule; Take 1 capsule by mouth every morning.  Dispense: 30 capsule;  Refill: 0 - amphetamine-dextroamphetamine (ADDERALL XR) 15 MG 24 hr capsule; Take 1 capsule by mouth every morning.  Dispense: 30 capsule; Refill: 0 - amphetamine-dextroamphetamine (ADDERALL XR) 15 MG 24 hr capsule; Take 1 capsule by mouth every morning.  Dispense: 30 capsule; Refill: 0 [place] Controlled Substance Database was reviewed by me for overdose risk score (ORS) Refilled Controlled medications today. Reviewed risks and possible side effects associated with taking Stimulants. Combination of these drugs with other psychotropic medications could cause dizziness and drowsiness. Pt needs to Monitor symptoms and exercise caution in driving and operating heavy machinery to avoid damages to oneself, to others and to the surroundings. Patient verbalized understanding in this matter. Dependence and abuse for these drugs will be monitored closely. A Controlled substance policy and procedure is on file  which allows [facility] to order a urine drug screen test at any visit. Patient understands and agrees with the plan..     General Counseling: Kathie Dike understanding of the findings of today's phone visit and agrees with plan of treatment. I have discussed any further diagnostic evaluation that may be needed or ordered today. We also reviewed her medications today. she has been encouraged to call the office with any questions or concerns that should arise related to todays visit.    No orders of the defined types were placed in this encounter.   Meds ordered this encounter  Medications   amphetamine-dextroamphetamine (ADDERALL XR) 15 MG 24 hr capsule    Sig: Take 1 capsule by mouth every morning.    Dispense:  30 capsule    Refill:  0   amphetamine-dextroamphetamine (ADDERALL XR) 15 MG 24 hr capsule    Sig: Take 1 capsule by mouth every morning.    Dispense:  30 capsule    Refill:  0   amphetamine-dextroamphetamine (ADDERALL XR) 15 MG 24 hr capsule    Sig: Take 1 capsule by mouth every morning.    Dispense:  30 capsule    Refill:  0    Time spent:25 Minutes    Dr Lyndon Code Internal medicine

## 2023-06-11 ENCOUNTER — Telehealth: Admitting: Physician Assistant

## 2023-06-11 DIAGNOSIS — T3695XA Adverse effect of unspecified systemic antibiotic, initial encounter: Secondary | ICD-10-CM | POA: Diagnosis not present

## 2023-06-11 DIAGNOSIS — B379 Candidiasis, unspecified: Secondary | ICD-10-CM

## 2023-06-11 MED ORDER — FLUCONAZOLE 150 MG PO TABS: 150.0000 mg | ORAL_TABLET | ORAL | 0 refills | Status: DC | PRN

## 2023-06-11 NOTE — Progress Notes (Signed)

## 2023-06-18 ENCOUNTER — Ambulatory Visit: Payer: Medicaid Other | Admitting: Physician Assistant

## 2023-06-27 ENCOUNTER — Telehealth: Payer: Self-pay | Admitting: Physician Assistant

## 2023-06-27 NOTE — Telephone Encounter (Signed)
 Patient on wait list. Lvm to put on for  06/28/23 appointment-Carolyn Edwards

## 2023-06-28 ENCOUNTER — Ambulatory Visit (INDEPENDENT_AMBULATORY_CARE_PROVIDER_SITE_OTHER): Admitting: Physician Assistant

## 2023-06-28 ENCOUNTER — Encounter: Payer: Self-pay | Admitting: Physician Assistant

## 2023-06-28 VITALS — BP 109/78 | HR 74 | Temp 97.8°F | Resp 16 | Ht 65.0 in | Wt 140.8 lb

## 2023-06-28 DIAGNOSIS — Z79899 Other long term (current) drug therapy: Secondary | ICD-10-CM

## 2023-06-28 DIAGNOSIS — R5383 Other fatigue: Secondary | ICD-10-CM

## 2023-06-28 DIAGNOSIS — R140 Abdominal distension (gaseous): Secondary | ICD-10-CM

## 2023-06-28 DIAGNOSIS — R7989 Other specified abnormal findings of blood chemistry: Secondary | ICD-10-CM

## 2023-06-28 DIAGNOSIS — R41840 Attention and concentration deficit: Secondary | ICD-10-CM

## 2023-06-28 DIAGNOSIS — E538 Deficiency of other specified B group vitamins: Secondary | ICD-10-CM

## 2023-06-28 DIAGNOSIS — E559 Vitamin D deficiency, unspecified: Secondary | ICD-10-CM | POA: Diagnosis not present

## 2023-06-28 LAB — POCT URINE DRUG SCREEN
Methylenedioxyamphetamine: NOT DETECTED
POC Amphetamine UR: POSITIVE — AB
POC BENZODIAZEPINES UR: NOT DETECTED
POC Barbiturate UR: NOT DETECTED
POC Cocaine UR: NOT DETECTED
POC Ecstasy UR: NOT DETECTED
POC Marijuana UR: NOT DETECTED
POC Methadone UR: NOT DETECTED
POC Methamphetamine UR: NOT DETECTED
POC Opiate Ur: NOT DETECTED
POC Oxycodone UR: NOT DETECTED
POC PHENCYCLIDINE UR: NOT DETECTED
POC TRICYCLICS UR: NOT DETECTED

## 2023-06-28 MED ORDER — AMPHETAMINE-DEXTROAMPHET ER 15 MG PO CP24
15.0000 mg | ORAL_CAPSULE | ORAL | 0 refills | Status: DC
Start: 2023-07-27 — End: 2023-09-27

## 2023-06-28 MED ORDER — AMPHETAMINE-DEXTROAMPHET ER 15 MG PO CP24: 15.0000 mg | ORAL_CAPSULE | ORAL | 0 refills | Status: DC

## 2023-06-28 NOTE — Progress Notes (Signed)
 [facility] [address] [place], [place] [address]  Internal MEDICINE  Office Visit Note  Patient Name: Carolyn Edwards  [id number]  [id number]  Date of Service: 07/17/2023  Chief Complaint  Patient presents with   Follow-up   Medication Refill    HPI Pt is here for routine follow up -recently got back from the beach -some more bloating and can't lose weight despite eating well--home cooked meals -periods a little off, all clots, every 3 weeks. Is due to get IUD checked and is going to schedule with GYN. Lower left pelvic pain at times. Would feel a little nauseous in AM. May need pelvic US  and will call if unable to see GYN soon for further evaluation -no urinary symptoms -would like to check all labs -doing well with adderall and needs refills. No side effects   Current Medication: Outpatient Encounter Medications as of 06/28/2023  Medication Sig   benzonatate  (TESSALON ) 100 MG capsule Take 1 capsule (100 mg total) by mouth 3 (three) times daily as needed for cough.   fluconazole  (DIFLUCAN ) 150 MG tablet Take 1 tablet (150 mg total) by mouth every 3 (three) days as needed.   fluticasone  (FLONASE ) 50 MCG/ACT nasal spray Place 2 sprays into both nostrils daily.   levonorgestrel  (MIRENA ) 20 MCG/DAY IUD 1 each by Intrauterine route once.   [DISCONTINUED] amphetamine -dextroamphetamine  (ADDERALL XR) 15 MG 24 hr capsule Take 1 capsule by mouth every morning.   [DISCONTINUED] amphetamine -dextroamphetamine  (ADDERALL XR) 15 MG 24 hr capsule Take 1 capsule by mouth every morning.   [DISCONTINUED] amphetamine -dextroamphetamine  (ADDERALL XR) 15 MG 24 hr capsule Take 1 capsule by mouth every morning.   [DISCONTINUED] doxycycline  (VIBRA -TABS) 100 MG tablet Take 1 tablet (100 mg total) by mouth 2 (two) times daily.   amphetamine -dextroamphetamine  (ADDERALL XR) 15 MG 24 hr capsule Take 1 capsule by mouth every morning.   [START ON 07/27/2023] amphetamine -dextroamphetamine   (ADDERALL XR) 15 MG 24 hr capsule Take 1 capsule by mouth every morning.   [START ON 08/24/2023] amphetamine -dextroamphetamine  (ADDERALL XR) 15 MG 24 hr capsule Take 1 capsule by mouth every morning.   No facility-administered encounter medications on file as of 06/28/2023.    Surgical History: Past Surgical History:  Procedure Laterality Date   BIOPSY N/A 03/15/2021   Procedure: BIOPSY;  Surgeon: Selena Daily, MD;  Location: [facility];  Service: Endoscopy;  Laterality: N/A;   BREAST ENHANCEMENT SURGERY     COLONOSCOPY WITH PROPOFOL  N/A 03/15/2021   Procedure: COLONOSCOPY WITH PROPOFOL ;  Surgeon: Selena Daily, MD;  Location: [facility];  Service: Endoscopy;  Laterality: N/A;   COLPOSCOPY  01/2005   bxs neg   ESOPHAGOGASTRODUODENOSCOPY (EGD) WITH PROPOFOL  N/A 03/15/2021   Procedure: ESOPHAGOGASTRODUODENOSCOPY (EGD) WITH PROPOFOL ;  Surgeon: Selena Daily, MD;  Location: [facility];  Service: Endoscopy;  Laterality: N/A;   FOREIGN BODY REMOVAL N/A 03/22/2017   Procedure: FOREIGN BODY REMOVAL;  Surgeon: Darl Edu, MD;  Location: [facility];  Service: Gynecology;  Laterality: N/A;   INTRAUTERINE DEVICE (IUD) INSERTION  08/2006   Mirena     Medical History: Past Medical History:  Diagnosis Date   ADHD    Dysmetabolic syndrome X 03/23/2011   Genital warts    LGSIL on Pap smear of cervix 2006   Migraine    Ovarian cyst 11/23/2015   Thyroid  nodule     Family History: Family History  Problem Relation Age of Onset   Diabetes Mother    Colon cancer Maternal  Grandfather 70   Lung cancer Paternal Grandfather     Social History   Socioeconomic History   Marital status: Married    Spouse name: Not on file   Number of children: Not on file   Years of education: Not on file   Highest education level: Not on file  Occupational History   Not on file  Tobacco Use   Smoking status: Never   Smokeless tobacco: Never  Vaping Use   Vaping  status: Never Used  Substance and Sexual Activity   Alcohol use: No   Drug use: No   Sexual activity: Yes    Birth control/protection: I.U.D.    Comment: Mirena   Other Topics Concern   Not on file  Social History Narrative   Not on file   Social Drivers of Health   Financial Resource Strain: Not on file  Food Insecurity: Not on file  Transportation Needs: Not on file  Physical Activity: Insufficiently Active (01/02/2017)   Exercise Vital Sign    Days of Exercise per Week: 3 days    Minutes of Exercise per Session: 30 min  Stress: No Stress Concern Present (01/02/2017)   [employer] of Occupational Health - Occupational Stress Questionnaire    Feeling of Stress : Only a little  Social Connections: Somewhat Isolated (01/02/2017)   Social Connection and Isolation Panel    Frequency of Communication with Friends and Family: More than three times a week    Frequency of Social Gatherings with Friends and Family: Twice a week    Attends Religious Services: Never    Database administrator or Organizations: No    Attends Banker Meetings: Never    Marital Status: Married  Catering manager Violence: Not At Risk (01/02/2017)   Humiliation, Afraid, Rape, and Kick questionnaire    Fear of Current or Ex-Partner: No    Emotionally Abused: No    Physically Abused: No    Sexually Abused: No      Review of Systems  Constitutional:  Negative for chills and diaphoresis.  HENT:  Negative for ear pain and postnasal drip.   Eyes:  Negative for photophobia and redness.  Respiratory:  Negative for cough, shortness of breath and wheezing.   Cardiovascular:  Negative for chest pain, palpitations and leg swelling.  Gastrointestinal:  Positive for abdominal distention and nausea. Negative for vomiting.  Genitourinary:  Positive for menstrual problem and pelvic pain. Negative for dysuria.  Musculoskeletal:  Negative for back pain.  Skin:  Negative for color change.  Neurological:   Negative for dizziness and headaches.  Hematological:  Does not bruise/bleed easily.  Psychiatric/Behavioral:  Positive for decreased concentration. Negative for behavioral problems (depression), hallucinations, self-injury, sleep disturbance and suicidal ideas. The patient is not nervous/anxious.     Vital Signs: BP 109/78   Pulse 74   Temp 97.8 F (36.6 C)   Resp 16   Ht 5' 5 (1.651 m)   Wt 140 lb 12.8 oz (63.9 kg)   SpO2 99%   BMI 23.43 kg/m    Physical Exam Vitals and nursing note reviewed.  Constitutional:      General: She is not in acute distress.    Appearance: She is well-developed and normal weight. She is not diaphoretic.  HENT:     Head: Normocephalic and atraumatic.   Eyes:     Extraocular Movements: Extraocular movements intact.   Neck:     Thyroid : No thyromegaly.     Vascular: No JVD.  Trachea: No tracheal deviation.   Cardiovascular:     Rate and Rhythm: Normal rate and regular rhythm.     Heart sounds: Normal heart sounds. No murmur heard.    No friction rub. No gallop.  Pulmonary:     Effort: Pulmonary effort is normal. No respiratory distress.     Breath sounds: No wheezing or rales.  Chest:     Chest wall: No tenderness.  Breasts:    Right: Normal. No mass.     Left: Normal. No mass.  Abdominal:     General: Bowel sounds are normal.     Palpations: Abdomen is soft.     Tenderness: There is no abdominal tenderness.   Musculoskeletal:        General: Normal range of motion.   Skin:    General: Skin is warm and dry.   Neurological:     Mental Status: She is alert and oriented to person, place, and time.   Psychiatric:        Behavior: Behavior normal.        Thought Content: Thought content normal.        Judgment: Judgment normal.        Assessment/Plan: 1. Attention and concentration deficit (Primary) Continue adderall as before, future refills sent - amphetamine -dextroamphetamine  (ADDERALL XR) 15 MG 24 hr capsule; Take  1 capsule by mouth every morning.  Dispense: 30 capsule; Refill: 0 - amphetamine -dextroamphetamine  (ADDERALL XR) 15 MG 24 hr capsule; Take 1 capsule by mouth every morning.  Dispense: 30 capsule; Refill: 0 - amphetamine -dextroamphetamine  (ADDERALL XR) 15 MG 24 hr capsule; Take 1 capsule by mouth every morning.  Dispense: 30 capsule; Refill: 0 [place] Controlled Substance Database was reviewed by me for overdose risk score (ORS) Refilled Controlled medications today. Reviewed risks and possible side effects associated with taking Stimulants. Combination of these drugs with other psychotropic medications could cause dizziness and drowsiness. Pt needs to Monitor symptoms and exercise caution in driving and operating heavy machinery to avoid damages to oneself, to others and to the surroundings. Patient verbalized understanding in this matter. Dependence and abuse for these drugs will be monitored closely. A Controlled substance policy and procedure is on file which allows [facility] to order a urine drug screen test at any visit. Patient understands and agrees with the plan..  2. Bloating Some cycle abnormality and LLQ pelvic pain with the bloating recently, states she is overdue for IUD check and plans to call GYN for further evaluation, Call if new or worsening symptoms, likely needs pelvic US   3. Vitamin D  deficiency - VITAMIN D  25 Hydroxy (Vit-D Deficiency, Fractures)  4. B12 deficiency - B12 and Folate Panel  5. Abnormal thyroid  blood test - TSH + free T4  6. Other fatigue - CBC w/Diff/Platelet - Comprehensive metabolic panel with GFR - TSH + free T4 - VITAMIN D  25 Hydroxy (Vit-D Deficiency, Fractures) - B12 and Folate Panel - Magnesium - Fe+TIBC+Fer  7. Encounter for long-term (current) use of high-risk medication - POCT Urine Drug Screen   General Counseling: Verlisa verbalizes understanding of the findings of todays visit and agrees with plan of treatment. I have discussed  any further diagnostic evaluation that may be needed or ordered today. We also reviewed her medications today. she has been encouraged to call the office with any questions or concerns that should arise related to todays visit.    Orders Placed This Encounter  Procedures   CBC w/Diff/Platelet   Comprehensive metabolic panel  with GFR   TSH + free T4   VITAMIN D  25 Hydroxy (Vit-D Deficiency, Fractures)   B12 and Folate Panel   Magnesium   Fe+TIBC+Fer   POCT Urine Drug Screen    Meds ordered this encounter  Medications   amphetamine -dextroamphetamine  (ADDERALL XR) 15 MG 24 hr capsule    Sig: Take 1 capsule by mouth every morning.    Dispense:  30 capsule    Refill:  0   amphetamine -dextroamphetamine  (ADDERALL XR) 15 MG 24 hr capsule    Sig: Take 1 capsule by mouth every morning.    Dispense:  30 capsule    Refill:  0   amphetamine -dextroamphetamine  (ADDERALL XR) 15 MG 24 hr capsule    Sig: Take 1 capsule by mouth every morning.    Dispense:  30 capsule    Refill:  0    This patient was seen by Taylor Favia, [name] in collaboration with Dr. Verneta Gone as a part of collaborative care agreement.   Total time spent:30 Minutes Time spent includes review of chart, medications, test results, and follow up plan with the patient.      Dr Fozia M Khan Internal medicine

## 2023-06-29 LAB — CBC WITH DIFFERENTIAL/PLATELET
Basophils Absolute: 0 10*3/uL (ref 0.0–0.2)
Basos: 1 %
EOS (ABSOLUTE): 0.5 10*3/uL — ABNORMAL HIGH (ref 0.0–0.4)
Eos: 12 %
Hematocrit: 39.6 % (ref 34.0–46.6)
Hemoglobin: 13.5 g/dL (ref 11.1–15.9)
Immature Grans (Abs): 0 10*3/uL (ref 0.0–0.1)
Immature Granulocytes: 0 %
Lymphocytes Absolute: 1.4 10*3/uL (ref 0.7–3.1)
Lymphs: 31 %
MCH: 31.3 pg (ref 26.6–33.0)
MCHC: 34.1 g/dL (ref 31.5–35.7)
MCV: 92 fL (ref 79–97)
Monocytes Absolute: 0.4 10*3/uL (ref 0.1–0.9)
Monocytes: 8 %
Neutrophils Absolute: 2.3 10*3/uL (ref 1.4–7.0)
Neutrophils: 48 %
Platelets: 182 10*3/uL (ref 150–450)
RBC: 4.32 x10E6/uL (ref 3.77–5.28)
RDW: 11.7 % (ref 11.7–15.4)
WBC: 4.7 10*3/uL (ref 3.4–10.8)

## 2023-06-29 LAB — COMPREHENSIVE METABOLIC PANEL WITH GFR
ALT: 18 IU/L (ref 0–32)
AST: 16 IU/L (ref 0–40)
Albumin: 4.6 g/dL (ref 3.9–4.9)
Alkaline Phosphatase: 55 IU/L (ref 44–121)
BUN/Creatinine Ratio: 30 — ABNORMAL HIGH (ref 9–23)
BUN: 21 mg/dL — ABNORMAL HIGH (ref 6–20)
Bilirubin Total: 0.3 mg/dL (ref 0.0–1.2)
CO2: 21 mmol/L (ref 20–29)
Calcium: 9 mg/dL (ref 8.7–10.2)
Chloride: 102 mmol/L (ref 96–106)
Creatinine, Ser: 0.7 mg/dL (ref 0.57–1.00)
Globulin, Total: 2.1 g/dL (ref 1.5–4.5)
Glucose: 86 mg/dL (ref 70–99)
Potassium: 4.3 mmol/L (ref 3.5–5.2)
Sodium: 139 mmol/L (ref 134–144)
Total Protein: 6.7 g/dL (ref 6.0–8.5)
eGFR: 116 mL/min/{1.73_m2} (ref >59)

## 2023-06-29 LAB — MAGNESIUM: Magnesium: 2 mg/dL (ref 1.6–2.3)

## 2023-06-29 LAB — VITAMIN D 25 HYDROXY (VIT D DEFICIENCY, FRACTURES): Vit D, 25-Hydroxy: 60.8 ng/mL (ref 30.0–100.0)

## 2023-06-29 LAB — B12 AND FOLATE PANEL
Folate: 8.9 ng/mL (ref >3.0)
Vitamin B-12: 468 pg/mL (ref 232–1245)

## 2023-06-29 LAB — IRON,TIBC AND FERRITIN PANEL
Ferritin: 97 ng/mL (ref 15–150)
Iron Saturation: 26 % (ref 15–55)
Iron: 78 ug/dL (ref 27–159)
Total Iron Binding Capacity: 303 ug/dL (ref 250–450)
UIBC: 225 ug/dL (ref 131–425)

## 2023-06-29 LAB — TSH+FREE T4
Free T4: 1.03 ng/dL (ref 0.82–1.77)
TSH: 1.35 u[IU]/mL (ref 0.450–4.500)

## 2023-07-02 ENCOUNTER — Ambulatory Visit: Payer: Self-pay | Admitting: Physician Assistant

## 2023-07-02 NOTE — Telephone Encounter (Signed)
 Sent MyChart to patient regarding labs.

## 2023-07-02 NOTE — Telephone Encounter (Signed)
-----   Message from Jacques Mattock sent at 07/02/2023  1:49 PM EDT ----- Please let her know that her eosinophil count was borderline which can sometimes happen with an allergy response. Also some signs of dehydration seen, otherwise her labs look good

## 2023-07-23 ENCOUNTER — Ambulatory Visit: Admitting: Physician Assistant

## 2023-07-25 NOTE — Progress Notes (Unsigned)
 McDonough, Tinnie POUR, [name]   No chief complaint on file.   HPI:      Carolyn Edwards is a 38 y.o. G1P1001 whose LMP was No LMP recorded. (Menstrual status: IUD)., presents today for ***  Mirena  placed 10/16/22  Patient Active Problem List   Diagnosis Date Noted   Chronic diarrhea of unknown origin    Abdominal bloating    Other fatigue 10/25/2017   Attention and concentration deficit 10/25/2017    Past Surgical History:  Procedure Laterality Date   BIOPSY N/A 03/15/2021   Procedure: BIOPSY;  Surgeon: Unk Corinn Skiff, MD;  Location: [facility];  Service: Endoscopy;  Laterality: N/A;   BREAST ENHANCEMENT SURGERY     COLONOSCOPY WITH PROPOFOL  N/A 03/15/2021   Procedure: COLONOSCOPY WITH PROPOFOL ;  Surgeon: Unk Corinn Skiff, MD;  Location: [facility];  Service: Endoscopy;  Laterality: N/A;   COLPOSCOPY  01/2005   bxs neg   ESOPHAGOGASTRODUODENOSCOPY (EGD) WITH PROPOFOL  N/A 03/15/2021   Procedure: ESOPHAGOGASTRODUODENOSCOPY (EGD) WITH PROPOFOL ;  Surgeon: Unk Corinn Skiff, MD;  Location: [facility];  Service: Endoscopy;  Laterality: N/A;   FOREIGN BODY REMOVAL N/A 03/22/2017   Procedure: FOREIGN BODY REMOVAL;  Surgeon: Lake Read, MD;  Location: [facility];  Service: Gynecology;  Laterality: N/A;   INTRAUTERINE DEVICE (IUD) INSERTION  08/2006   Mirena     Family History  Problem Relation Age of Onset   Diabetes Mother    Colon cancer Maternal Grandfather 28   Lung cancer Paternal Grandfather     Social History   Socioeconomic History   Marital status: Married    Spouse name: Not on file   Number of children: Not on file   Years of education: Not on file   Highest education level: Not on file  Occupational History   Not on file  Tobacco Use   Smoking status: Never   Smokeless tobacco: Never  Vaping Use   Vaping status: Never Used  Substance and Sexual Activity   Alcohol use: No   Drug use: No   Sexual activity:  Yes    Birth control/protection: I.U.D.    Comment: Mirena   Other Topics Concern   Not on file  Social History Narrative   Not on file   Social Drivers of Health   Financial Resource Strain: Not on file  Food Insecurity: Not on file  Transportation Needs: Not on file  Physical Activity: Insufficiently Active (01/02/2017)   Exercise Vital Sign    Days of Exercise per Week: 3 days    Minutes of Exercise per Session: 30 min  Stress: No Stress Concern Present (01/02/2017)   [employer] of Occupational Health - Occupational Stress Questionnaire    Feeling of Stress : Only a little  Social Connections: Somewhat Isolated (01/02/2017)   Social Connection and Isolation Panel    Frequency of Communication with Friends and Family: More than three times a week    Frequency of Social Gatherings with Friends and Family: Twice a week    Attends Religious Services: Never    Database administrator or Organizations: No    Attends Banker Meetings: Never    Marital Status: Married  Catering manager Violence: Not At Risk (01/02/2017)   Humiliation, Afraid, Rape, and Kick questionnaire    Fear of Current or Ex-Partner: No    Emotionally Abused: No    Physically Abused: No    Sexually Abused: No    Outpatient Medications Prior  to Visit  Medication Sig Dispense Refill   amphetamine -dextroamphetamine  (ADDERALL XR) 15 MG 24 hr capsule Take 1 capsule by mouth every morning. 30 capsule 0   [START ON 07/27/2023] amphetamine -dextroamphetamine  (ADDERALL XR) 15 MG 24 hr capsule Take 1 capsule by mouth every morning. 30 capsule 0   [START ON 08/24/2023] amphetamine -dextroamphetamine  (ADDERALL XR) 15 MG 24 hr capsule Take 1 capsule by mouth every morning. 30 capsule 0   benzonatate  (TESSALON ) 100 MG capsule Take 1 capsule (100 mg total) by mouth 3 (three) times daily as needed for cough. 30 capsule 0   fluconazole  (DIFLUCAN ) 150 MG tablet Take 1 tablet (150 mg total) by mouth every 3 (three)  days as needed. 2 tablet 0   fluticasone  (FLONASE ) 50 MCG/ACT nasal spray Place 2 sprays into both nostrils daily. 16 g 0   levonorgestrel  (MIRENA ) 20 MCG/DAY IUD 1 each by Intrauterine route once.     No facility-administered medications prior to visit.      ROS:  Review of Systems BREAST: No symptoms   OBJECTIVE:   Vitals:  There were no vitals taken for this visit.  Physical Exam  Results: No results found for this or any previous visit (from the past 24 hours).   Assessment/Plan: No diagnosis found.    No orders of the defined types were placed in this encounter.     No follow-ups on file.  Annemarie Sebree B. Eleen Litz, [name] 07/25/2023 5:46 PM

## 2023-07-26 ENCOUNTER — Encounter: Payer: Self-pay | Admitting: Obstetrics and Gynecology

## 2023-07-26 ENCOUNTER — Other Ambulatory Visit (HOSPITAL_COMMUNITY)
Admission: RE | Admit: 2023-07-26 | Discharge: 2023-07-26 | Disposition: A | Discharge status: Home / Self Care | Source: Ambulatory Visit | Attending: Obstetrics and Gynecology | Admitting: Obstetrics and Gynecology

## 2023-07-26 ENCOUNTER — Ambulatory Visit: Admitting: Obstetrics and Gynecology

## 2023-07-26 VITALS — BP 102/69 | HR 78 | Ht 64.0 in | Wt 141.0 lb

## 2023-07-26 DIAGNOSIS — Z30431 Encounter for routine checking of intrauterine contraceptive device: Secondary | ICD-10-CM

## 2023-07-26 DIAGNOSIS — Z1151 Encounter for screening for human papillomavirus (HPV): Secondary | ICD-10-CM | POA: Diagnosis present

## 2023-07-26 DIAGNOSIS — R87610 Atypical squamous cells of undetermined significance on cytologic smear of cervix (ASC-US): Secondary | ICD-10-CM

## 2023-07-26 DIAGNOSIS — Z124 Encounter for screening for malignant neoplasm of cervix: Secondary | ICD-10-CM | POA: Insufficient documentation

## 2023-07-26 DIAGNOSIS — K5901 Slow transit constipation: Secondary | ICD-10-CM

## 2023-07-26 DIAGNOSIS — R102 Pelvic and perineal pain: Secondary | ICD-10-CM | POA: Diagnosis not present

## 2023-07-26 NOTE — Patient Instructions (Signed)
 I value your feedback and you entrusting [place] with your care. If you get a [place] patient survey, I would appreciate you taking the time to let us know about your experience today. Thank you! ? ? ?

## 2023-07-30 ENCOUNTER — Other Ambulatory Visit

## 2023-08-07 LAB — CYTOLOGY - PAP
Comment: NEGATIVE
Diagnosis: UNDETERMINED — AB
High risk HPV: NEGATIVE

## 2023-08-09 ENCOUNTER — Ambulatory Visit: Payer: Self-pay | Admitting: Obstetrics and Gynecology

## 2023-08-10 ENCOUNTER — Ambulatory Visit (INDEPENDENT_AMBULATORY_CARE_PROVIDER_SITE_OTHER)

## 2023-08-10 DIAGNOSIS — Z30431 Encounter for routine checking of intrauterine contraceptive device: Secondary | ICD-10-CM | POA: Diagnosis not present

## 2023-08-10 DIAGNOSIS — R102 Pelvic and perineal pain: Secondary | ICD-10-CM

## 2023-08-12 ENCOUNTER — Encounter: Payer: Self-pay | Admitting: Obstetrics and Gynecology

## 2023-08-16 NOTE — Progress Notes (Signed)
Pls call pt to schedule colpo with MD. She is aware. Thx.

## 2023-08-20 ENCOUNTER — Encounter: Payer: Self-pay | Admitting: Obstetrics and Gynecology

## 2023-08-20 ENCOUNTER — Ambulatory Visit: Admitting: Obstetrics and Gynecology

## 2023-08-20 VITALS — BP 99/66 | HR 80 | Ht 65.0 in | Wt 142.0 lb

## 2023-08-20 DIAGNOSIS — Z30432 Encounter for removal of intrauterine contraceptive device: Secondary | ICD-10-CM | POA: Diagnosis not present

## 2023-08-20 DIAGNOSIS — Z30011 Encounter for initial prescription of contraceptive pills: Secondary | ICD-10-CM

## 2023-08-20 MED ORDER — NORETHINDRONE 0.35 MG PO TABS: 1.0000 | ORAL_TABLET | Freq: Every day | ORAL | 3 refills | Status: AC

## 2023-08-20 NOTE — Patient Instructions (Signed)
 I value your feedback and you entrusting [place] with your care. If you get a [place] patient survey, I would appreciate you taking the time to let us know about your experience today. Thank you! ? ? ?

## 2023-08-20 NOTE — Progress Notes (Signed)
 McDonough, Tinnie POUR, [name]   Chief Complaint  Patient presents with   IUD Removal    Undecided of BC    HPI:      [name] is a 38 y.o. G1P1001 whose LMP was No LMP recorded (lmp unknown). (Menstrual status: IUD)., presents today for IUD removal due to malposition IUD, confirmed on Gyn u/s 08/10/23. Was having intermittent pelvic pain. Mirena  placed 10/16/22; initially had minimal bleeding but increased brown spotting/red bleeding past couple of months with small clots, no dysmen. Pt interested in OCPs. Hx of migraines with aura.  She is sexually active, no pain/bleeding. Has colpo scheduled 8/25.   Patient Active Problem List   Diagnosis Date Noted   ASCUS of cervix with negative high risk HPV 07/26/2023   Chronic diarrhea of unknown origin    Abdominal bloating    Other fatigue 10/25/2017   Attention and concentration deficit 10/25/2017    Past Surgical History:  Procedure Laterality Date   BIOPSY N/A 03/15/2021   Procedure: BIOPSY;  Surgeon: Unk Corinn Skiff, MD;  Location: [facility];  Service: Endoscopy;  Laterality: N/A;   BREAST ENHANCEMENT SURGERY     COLONOSCOPY WITH PROPOFOL  N/A 03/15/2021   Procedure: COLONOSCOPY WITH PROPOFOL ;  Surgeon: Unk Corinn Skiff, MD;  Location: [facility];  Service: Endoscopy;  Laterality: N/A;   COLPOSCOPY  01/2005   bxs neg   ESOPHAGOGASTRODUODENOSCOPY (EGD) WITH PROPOFOL  N/A 03/15/2021   Procedure: ESOPHAGOGASTRODUODENOSCOPY (EGD) WITH PROPOFOL ;  Surgeon: Unk Corinn Skiff, MD;  Location: [facility];  Service: Endoscopy;  Laterality: N/A;   FOREIGN BODY REMOVAL N/A 03/22/2017   Procedure: FOREIGN BODY REMOVAL;  Surgeon: Lake Read, MD;  Location: [facility];  Service: Gynecology;  Laterality: N/A;   INTRAUTERINE DEVICE (IUD) INSERTION  08/2006   Mirena     Family History  Problem Relation Age of Onset   Diabetes Mother    Colon cancer Maternal Grandfather 77   Lung cancer  Paternal Grandfather     Social History   Socioeconomic History   Marital status: Married    Spouse name: Not on file   Number of children: Not on file   Years of education: Not on file   Highest education level: Not on file  Occupational History   Not on file  Tobacco Use   Smoking status: Never   Smokeless tobacco: Never  Vaping Use   Vaping status: Never Used  Substance and Sexual Activity   Alcohol use: No   Drug use: No   Sexual activity: Yes    Birth control/protection: I.U.D.    Comment: Mirena   Other Topics Concern   Not on file  Social History Narrative   Not on file   Social Drivers of Health   Financial Resource Strain: Not on file  Food Insecurity: Not on file  Transportation Needs: Not on file  Physical Activity: Insufficiently Active (01/02/2017)   Exercise Vital Sign    Days of Exercise per Week: 3 days    Minutes of Exercise per Session: 30 min  Stress: No Stress Concern Present (01/02/2017)   [employer] of Occupational Health - Occupational Stress Questionnaire    Feeling of Stress : Only a little  Social Connections: Somewhat Isolated (01/02/2017)   Social Connection and Isolation Panel    Frequency of Communication with Friends and Family: More than three times a week    Frequency of Social Gatherings with Friends and Family: Twice a week  Attends Religious Services: Never    Active Member of Clubs or Organizations: No    Attends Banker Meetings: Never    Marital Status: Married  Catering manager Violence: Not At Risk (01/02/2017)   Humiliation, Afraid, Rape, and Kick questionnaire    Fear of Current or Ex-Partner: No    Emotionally Abused: No    Physically Abused: No    Sexually Abused: No    Outpatient Medications Prior to Visit  Medication Sig Dispense Refill   amphetamine -dextroamphetamine  (ADDERALL XR) 15 MG 24 hr capsule Take 1 capsule by mouth every morning. 30 capsule 0   levonorgestrel  (MIRENA ) 20 MCG/DAY IUD 1  each by Intrauterine route once.     [START ON 08/24/2023] amphetamine -dextroamphetamine  (ADDERALL XR) 15 MG 24 hr capsule Take 1 capsule by mouth every morning. (Patient not taking: Reported on 08/20/2023) 30 capsule 0   No facility-administered medications prior to visit.      ROS:  Review of Systems  Constitutional:  Negative for fever.  Gastrointestinal:  Negative for blood in stool, constipation, diarrhea, nausea and vomiting.  Genitourinary:  Negative for dyspareunia, dysuria, flank pain, frequency, hematuria, urgency, vaginal bleeding, vaginal discharge and vaginal pain.  Musculoskeletal:  Negative for back pain.  Skin:  Negative for rash.    OBJECTIVE:   Vitals:  BP 99/66   Pulse 80   Ht 5' 5 (1.651 m)   Wt 142 lb (64.4 kg)   LMP  (LMP Unknown)   BMI 23.63 kg/m    Pelvic exam:  Two IUD strings present seen coming from the cervical os. EGBUS, vaginal vault and cervix: within normal limits  IUD Removal Strings of IUD identified and grasped.  IUD removed without problem with ring forceps.  Pt tolerated this well.  IUD noted to be intact.   Assessment/Plan: Encounter for IUD removal  Encounter for initial prescription of contraceptive pills - Plan: norethindrone  (MICRONOR ) 0.35 MG tablet; prog only BC discussed, pt wants to try POPs. Rx eRxd. Condoms for 1 wk and 1 wk if late more than 3 hrs.    Meds ordered this encounter  Medications   norethindrone  (MICRONOR ) 0.35 MG tablet    Sig: Take 1 tablet (0.35 mg total) by mouth daily.    Dispense:  84 tablet    Refill:  3    Supervising Provider:   LEIGH SOBER [[id number]]      Return if symptoms worsen or fail to improve.  Kaytie Ratcliffe B. Deziray Nabi, [name] 08/20/2023 3:08 PM

## 2023-09-10 NOTE — Progress Notes (Unsigned)
 Referring Provider:  Bernarda Schroeder, PA  HPI:  Romaine Maciolek is a 38 y.o.  G1P1001  who presents today for evaluation and management of abnormal cervical cytology.    Prior pap smears:  Date: 07/26/23   Eje:Jdrld    HPV: negative  Date: 05/11/22 Eje:Jdrld       HPV: negative Date: 03/02/21 Eje:Jdrld       YEC:wzhjupcz  Date: 11/27/19 Eje:Jdrld       YEC:Wzhjupcz  Prior cervical / vaginal findings: reports possible colposcopy many years ago  Prior cervical treatment(s): N/A  Symptoms/History:  -Abnormal vaginal discharge: none -Postmenopausal: no -Intermenstrual bleeding: none -Postcoital bleeding: none -Bleeding problems (non-gyn): none -Contraception: none -Number of current sexual partners: 1 -Number of partners in lifetime: 5 -History of a high risk partner: none -History of STDs: chlamydia as a teen - treated -Smoking: no -Gardasil Vaccine: thinks she got some but did not complete series      ROS:  Pertinent items are noted in HPI.  OB History  Gravida Para Term Preterm AB Living  1 1 1   1   SAB IAB Ectopic Multiple Live Births      1    # Outcome Date GA Lbr Len/2nd Weight Sex Type Anes PTL Lv  1 Term 07/26/06     VBAC       Past Medical History:  Diagnosis Date   ADHD    Dysmetabolic syndrome X 03/23/2011   Genital warts    LGSIL on Pap smear of cervix 2006   Migraine    Ovarian cyst 11/23/2015   Thyroid  nodule     Past Surgical History:  Procedure Laterality Date   BIOPSY N/A 03/15/2021   Procedure: BIOPSY;  Surgeon: Unk Corinn Skiff, MD;  Location: [facility];  Service: Endoscopy;  Laterality: N/A;   BREAST ENHANCEMENT SURGERY     COLONOSCOPY WITH PROPOFOL  N/A 03/15/2021   Procedure: COLONOSCOPY WITH PROPOFOL ;  Surgeon: Unk Corinn Skiff, MD;  Location: [facility];  Service: Endoscopy;  Laterality: N/A;   COLPOSCOPY  01/2005   bxs neg   ESOPHAGOGASTRODUODENOSCOPY (EGD) WITH PROPOFOL  N/A 03/15/2021   Procedure:  ESOPHAGOGASTRODUODENOSCOPY (EGD) WITH PROPOFOL ;  Surgeon: Unk Corinn Skiff, MD;  Location: [facility];  Service: Endoscopy;  Laterality: N/A;   FOREIGN BODY REMOVAL N/A 03/22/2017   Procedure: FOREIGN BODY REMOVAL;  Surgeon: Lake Read, MD;  Location: [facility];  Service: Gynecology;  Laterality: N/A;   INTRAUTERINE DEVICE (IUD) INSERTION  08/2006   Mirena     SOCIAL HISTORY:  Social History   Substance and Sexual Activity  Alcohol Use No    Social History   Substance and Sexual Activity  Drug Use No     Family History  Problem Relation Age of Onset   Diabetes Mother    Colon cancer Maternal Grandfather 24   Lung cancer Paternal Grandfather     ALLERGIES:  Imitrex [sumatriptan]  She has a current medication list which includes the following prescription(s): amphetamine -dextroamphetamine , amphetamine -dextroamphetamine , and norethindrone .  Physical Exam: -Vitals:  BP 109/74   Pulse 68   Ht 5' 4 (1.626 m)   Wt 143 lb 3.2 oz (65 kg)   LMP 09/12/2023   BMI 24.58 kg/m   Physical Exam Vitals and nursing note reviewed. Exam conducted with a chaperone present.  Constitutional:      Appearance: Normal appearance.  HENT:     Head: Normocephalic and atraumatic.  Eyes:     Extraocular Movements: Extraocular movements intact.  Pulmonary:     Effort: Pulmonary effort is normal.  Neurological:     General: No focal deficit present.     Mental Status: She is alert.  Psychiatric:        Mood and Affect: Mood normal.      ASSESSMENT:  Kathrin Folden is a 38 y.o. G1P1001 with ASCUS and HPV-HR negative on recent pap (07/26/23), referred for colposcopy today.   -We discussed ASCCP guidelines and for pt's age, and pap result, recommendation is to repeat co-testing in 5yrs instead of the usual five.  Offered colposcopy exam anyway since she was here and pt declined. Encouraged her to ensure she follows up annually for her preventative exam and will need  another pap in June of 2028. Follow up sooner prn.    Estil Mangle, DO [facility]

## 2023-09-19 ENCOUNTER — Encounter: Payer: Self-pay | Admitting: Obstetrics

## 2023-09-19 ENCOUNTER — Ambulatory Visit (INDEPENDENT_AMBULATORY_CARE_PROVIDER_SITE_OTHER): Admitting: Obstetrics

## 2023-09-19 VITALS — BP 109/74 | HR 68 | Ht 64.0 in | Wt 143.2 lb

## 2023-09-19 DIAGNOSIS — R87610 Atypical squamous cells of undetermined significance on cytologic smear of cervix (ASC-US): Secondary | ICD-10-CM

## 2023-09-19 DIAGNOSIS — Z01812 Encounter for preprocedural laboratory examination: Secondary | ICD-10-CM

## 2023-09-27 ENCOUNTER — Encounter: Payer: Self-pay | Admitting: Physician Assistant

## 2023-09-27 ENCOUNTER — Ambulatory Visit (INDEPENDENT_AMBULATORY_CARE_PROVIDER_SITE_OTHER): Admitting: Physician Assistant

## 2023-09-27 VITALS — BP 110/85 | HR 83 | Temp 97.6°F | Resp 16 | Ht 65.0 in | Wt 142.0 lb

## 2023-09-27 DIAGNOSIS — R41840 Attention and concentration deficit: Secondary | ICD-10-CM

## 2023-09-27 MED ORDER — AMPHETAMINE-DEXTROAMPHETAMINE 15 MG PO TABS: 15.0000 mg | ORAL_TABLET | Freq: Every day | ORAL | 0 refills | Status: AC

## 2023-09-27 MED ORDER — AMPHETAMINE-DEXTROAMPHETAMINE 15 MG PO TABS
15.0000 mg | ORAL_TABLET | Freq: Every day | ORAL | 0 refills | Status: AC
Start: 2023-11-27 — End: ?

## 2023-09-27 NOTE — Progress Notes (Signed)
 [facility] [address] [place], [place] [address]  Internal MEDICINE  Office Visit Note  Patient Name: Carolyn Edwards  [id number]  [id number]  Date of Service: 09/27/2023  Chief Complaint  Patient presents with   Follow-up   Medication Refill    HPI Pt is here for routine follow up -XR adderall impacting sleep if taken a little late and takes awhile to kick in.  -Would like to go back to IR in AM as needed, but would like to stay at the 15mg  once in AM -otherwise no S/E and finds it helpful for focus -does report insurance ending and may need to space appts some but understands only 3 months of refills can be given and is fine with this -running in the mornings now, a little congestion recently with early morning runs with weather changes but feeling well  Current Medication: Outpatient Encounter Medications as of 09/27/2023  Medication Sig   amphetamine -dextroamphetamine  (ADDERALL XR) 15 MG 24 hr capsule Take 1 capsule by mouth every morning.   amphetamine -dextroamphetamine  (ADDERALL XR) 15 MG 24 hr capsule Take 1 capsule by mouth every morning.   norethindrone  (MICRONOR ) 0.35 MG tablet Take 1 tablet (0.35 mg total) by mouth daily.   No facility-administered encounter medications on file as of 09/27/2023.    Surgical History: Past Surgical History:  Procedure Laterality Date   BIOPSY N/A 03/15/2021   Procedure: BIOPSY;  Surgeon: Unk Corinn Skiff, MD;  Location: [facility];  Service: Endoscopy;  Laterality: N/A;   BREAST ENHANCEMENT SURGERY     COLONOSCOPY WITH PROPOFOL  N/A 03/15/2021   Procedure: COLONOSCOPY WITH PROPOFOL ;  Surgeon: Unk Corinn Skiff, MD;  Location: [facility];  Service: Endoscopy;  Laterality: N/A;   COLPOSCOPY  01/2005   bxs neg   ESOPHAGOGASTRODUODENOSCOPY (EGD) WITH PROPOFOL  N/A 03/15/2021   Procedure: ESOPHAGOGASTRODUODENOSCOPY (EGD) WITH PROPOFOL ;  Surgeon: Unk Corinn Skiff, MD;  Location: [facility];  Service: Endoscopy;  Laterality: N/A;   FOREIGN BODY REMOVAL N/A 03/22/2017   Procedure: FOREIGN BODY REMOVAL;  Surgeon: Lake Read, MD;  Location: [facility];  Service: Gynecology;  Laterality: N/A;   INTRAUTERINE DEVICE (IUD) INSERTION  08/2006   Mirena     Medical History: Past Medical History:  Diagnosis Date   ADHD    Dysmetabolic syndrome X 03/23/2011   Genital warts    LGSIL on Pap smear of cervix 2006   Migraine    Ovarian cyst 11/23/2015   Thyroid  nodule     Family History: Family History  Problem Relation Age of Onset   Diabetes Mother    Colon cancer Maternal Grandfather 65   Lung cancer Paternal Grandfather     Social History   Socioeconomic History   Marital status: Married    Spouse name: Not on file   Number of children: Not on file   Years of education: Not on file   Highest education level: Not on file  Occupational History   Not on file  Tobacco Use   Smoking status: Never   Smokeless tobacco: Never  Vaping Use   Vaping status: Never Used  Substance and Sexual Activity   Alcohol use: No   Drug use: No   Sexual activity: Yes    Birth control/protection: I.U.D.    Comment: Mirena   Other Topics Concern   Not on file  Social History Narrative   Not on file   Social Drivers of Health   Financial Resource Strain: Not on file  Food  Insecurity: Not on file  Transportation Needs: Not on file  Physical Activity: Insufficiently Active (01/02/2017)   Exercise Vital Sign    Days of Exercise per Week: 3 days    Minutes of Exercise per Session: 30 min  Stress: No Stress Concern Present (01/02/2017)   [employer] of Occupational Health - Occupational Stress Questionnaire    Feeling of Stress : Only a little  Social Connections: Somewhat Isolated (01/02/2017)   Social Connection and Isolation Panel    Frequency of Communication with Friends and Family: More than three times a week    Frequency of Social Gatherings with Friends and  Family: Twice a week    Attends Religious Services: Never    Database administrator or Organizations: No    Attends Banker Meetings: Never    Marital Status: Married  Catering manager Violence: Not At Risk (01/02/2017)   Humiliation, Afraid, Rape, and Kick questionnaire    Fear of Current or Ex-Partner: No    Emotionally Abused: No    Physically Abused: No    Sexually Abused: No      Review of Systems  Constitutional:  Negative for chills and diaphoresis.  HENT:  Positive for congestion. Negative for ear pain and postnasal drip.   Eyes:  Negative for photophobia and redness.  Respiratory:  Negative for cough, shortness of breath and wheezing.   Cardiovascular:  Negative for chest pain, palpitations and leg swelling.  Gastrointestinal:  Negative for abdominal pain and vomiting.  Genitourinary:  Negative for dysuria.  Musculoskeletal:  Negative for back pain.  Skin:  Negative for color change.  Neurological:  Negative for dizziness and headaches.  Hematological:  Does not bruise/bleed easily.  Psychiatric/Behavioral:  Positive for decreased concentration. Negative for behavioral problems (depression), hallucinations, self-injury, sleep disturbance and suicidal ideas. The patient is not nervous/anxious.     Vital Signs: BP 110/85   Pulse 83   Temp 97.6 F (36.4 C)   Resp 16   Ht 5' 5 (1.651 m)   Wt 142 lb (64.4 kg)   LMP 09/12/2023   SpO2 99%   BMI 23.63 kg/m    Physical Exam Vitals and nursing note reviewed.  Constitutional:      General: She is not in acute distress.    Appearance: She is well-developed and normal weight. She is not diaphoretic.  HENT:     Head: Normocephalic and atraumatic.  Eyes:     Extraocular Movements: Extraocular movements intact.  Neck:     Thyroid : No thyromegaly.     Vascular: No JVD.     Trachea: No tracheal deviation.  Cardiovascular:     Rate and Rhythm: Normal rate and regular rhythm.     Heart sounds: Normal heart  sounds. No murmur heard.    No friction rub. No gallop.  Pulmonary:     Effort: Pulmonary effort is normal. No respiratory distress.     Breath sounds: No wheezing or rales.  Chest:     Chest wall: No tenderness.  Breasts:    Right: Normal. No mass.     Left: Normal. No mass.  Skin:    General: Skin is warm and dry.  Neurological:     Mental Status: She is alert and oriented to person, place, and time.  Psychiatric:        Behavior: Behavior normal.        Thought Content: Thought content normal.        Judgment: Judgment normal.  Assessment/Plan: 1. Attention and concentration deficit (Primary) May continue adderall, will switch to 15mg  IR form in AM, may split in half if needed. - amphetamine -dextroamphetamine  (ADDERALL) 15 MG tablet; Take 1 tablet by mouth daily.  Dispense: 30 tablet; Refill: 0 - amphetamine -dextroamphetamine  (ADDERALL) 15 MG tablet; Take 1 tablet by mouth daily.  Dispense: 30 tablet; Refill: 0 - amphetamine -dextroamphetamine  (ADDERALL) 15 MG tablet; Take 1 tablet by mouth daily.  Dispense: 30 tablet; Refill: 0 [place] Controlled Substance Database was reviewed by me for overdose risk score (ORS) Refilled Controlled medications today. Reviewed risks and possible side effects associated with taking Stimulants. Combination of these drugs with other psychotropic medications could cause dizziness and drowsiness. Pt needs to Monitor symptoms and exercise caution in driving and operating heavy machinery to avoid damages to oneself, to others and to the surroundings. Patient verbalized understanding in this matter. Dependence and abuse for these drugs will be monitored closely. A Controlled substance policy and procedure is on file which allows [facility] to order a urine drug screen test at any visit. Patient understands and agrees with the plan.   General Counseling: demi trieu understanding of the findings of todays visit and agrees with plan of  treatment. I have discussed any further diagnostic evaluation that may be needed or ordered today. We also reviewed her medications today. she has been encouraged to call the office with any questions or concerns that should arise related to todays visit.    No orders of the defined types were placed in this encounter.   No orders of the defined types were placed in this encounter.   This patient was seen by Tinnie Pro, [name] in collaboration with Dr. Sigrid Bathe as a part of collaborative care agreement.   Total time spent:25 Minutes Time spent includes review of chart, medications, test results, and follow up plan with the patient.      Dr Fozia M Khan Internal medicine

## 2024-02-01 ENCOUNTER — Ambulatory Visit: Admitting: Physician Assistant
